# Patient Record
Sex: Female | Born: 1955
Health system: Southern US, Community
[De-identification: ages and names within clinical notes are randomized; demographics above are authoritative.]

## PROBLEM LIST (undated history)

## (undated) DIAGNOSIS — Z1211 Encounter for screening for malignant neoplasm of colon: Secondary | ICD-10-CM

## (undated) DIAGNOSIS — F32A Depression, unspecified: Secondary | ICD-10-CM

## (undated) DIAGNOSIS — K219 Gastro-esophageal reflux disease without esophagitis: Secondary | ICD-10-CM

## (undated) DIAGNOSIS — K5790 Diverticulosis of intestine, part unspecified, without perforation or abscess without bleeding: Secondary | ICD-10-CM

## (undated) DIAGNOSIS — F419 Anxiety disorder, unspecified: Secondary | ICD-10-CM

## (undated) DIAGNOSIS — M1990 Unspecified osteoarthritis, unspecified site: Secondary | ICD-10-CM

## (undated) DIAGNOSIS — F329 Major depressive disorder, single episode, unspecified: Secondary | ICD-10-CM

## (undated) HISTORY — DX: Encounter for screening for malignant neoplasm of colon: Z12.11

## (undated) HISTORY — PX: BREAST BIOPSY: SHX20

## (undated) HISTORY — PX: TONSILLECTOMY: SUR1361

## (undated) HISTORY — DX: Diverticulosis of intestine, part unspecified, without perforation or abscess without bleeding: K57.90

## (undated) HISTORY — PX: ABDOMINAL HYSTERECTOMY: SHX81

---
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
## 2004-09-28 ENCOUNTER — Ambulatory Visit: Payer: Self-pay | Admitting: Internal Medicine

## 2005-04-14 HISTORY — PX: BREAST LUMPECTOMY: SHX2

## 2005-09-25 ENCOUNTER — Encounter: Admission: RE | Admit: 2005-09-25 | Discharge: 2005-09-25 | Payer: Self-pay | Admitting: General Surgery

## 2005-10-05 ENCOUNTER — Encounter: Admission: RE | Admit: 2005-10-05 | Discharge: 2005-10-05 | Payer: Self-pay | Admitting: General Surgery

## 2005-10-27 ENCOUNTER — Ambulatory Visit (HOSPITAL_BASED_OUTPATIENT_CLINIC_OR_DEPARTMENT_OTHER): Admission: RE | Admit: 2005-10-27 | Discharge: 2005-10-27 | Payer: Self-pay | Admitting: General Surgery

## 2005-10-27 ENCOUNTER — Encounter: Admission: RE | Admit: 2005-10-27 | Discharge: 2005-10-27 | Payer: Self-pay | Admitting: General Surgery

## 2008-07-12 ENCOUNTER — Ambulatory Visit: Payer: Self-pay

## 2008-09-21 ENCOUNTER — Ambulatory Visit: Payer: Self-pay | Admitting: Obstetrics and Gynecology

## 2008-09-25 ENCOUNTER — Ambulatory Visit: Payer: Self-pay | Admitting: Obstetrics and Gynecology

## 2010-05-05 ENCOUNTER — Encounter: Payer: Self-pay | Admitting: General Surgery

## 2012-02-23 LAB — HM DEXA SCAN

## 2012-04-09 ENCOUNTER — Ambulatory Visit: Payer: Self-pay | Admitting: Gastroenterology

## 2012-07-10 ENCOUNTER — Emergency Department: Payer: Self-pay | Admitting: Internal Medicine

## 2012-07-10 LAB — CBC WITH DIFFERENTIAL/PLATELET
Basophil #: 0 10*3/uL (ref 0.0–0.1)
Eosinophil #: 0.2 10*3/uL (ref 0.0–0.7)
Lymphocyte %: 32.5 %
Neutrophil #: 2.7 10*3/uL (ref 1.4–6.5)
Platelet: 272 10*3/uL (ref 150–440)
RBC: 4.06 10*6/uL (ref 3.80–5.20)
WBC: 4.9 10*3/uL (ref 3.6–11.0)

## 2012-07-10 LAB — BASIC METABOLIC PANEL
Chloride: 111 mmol/L — ABNORMAL HIGH (ref 98–107)
Creatinine: 0.63 mg/dL (ref 0.60–1.30)
EGFR (African American): >60
Osmolality: 281 (ref 275–301)
Potassium: 3.6 mmol/L (ref 3.5–5.1)

## 2013-06-01 ENCOUNTER — Ambulatory Visit: Payer: Self-pay | Admitting: Obstetrics and Gynecology

## 2013-06-13 ENCOUNTER — Ambulatory Visit: Payer: Self-pay | Admitting: Obstetrics and Gynecology

## 2016-11-13 ENCOUNTER — Other Ambulatory Visit: Payer: Self-pay

## 2016-11-14 ENCOUNTER — Ambulatory Visit: Payer: BLUE CROSS/BLUE SHIELD

## 2016-11-14 ENCOUNTER — Encounter: Payer: Self-pay | Admitting: Podiatry

## 2016-11-14 ENCOUNTER — Ambulatory Visit (INDEPENDENT_AMBULATORY_CARE_PROVIDER_SITE_OTHER): Payer: BLUE CROSS/BLUE SHIELD

## 2016-11-14 ENCOUNTER — Ambulatory Visit (INDEPENDENT_AMBULATORY_CARE_PROVIDER_SITE_OTHER): Payer: BLUE CROSS/BLUE SHIELD | Admitting: Podiatry

## 2016-11-14 VITALS — BP 112/75 | HR 94 | Temp 99.6°F | Resp 16

## 2016-11-14 DIAGNOSIS — R52 Pain, unspecified: Secondary | ICD-10-CM

## 2016-11-14 DIAGNOSIS — L84 Corns and callosities: Secondary | ICD-10-CM | POA: Diagnosis not present

## 2016-11-14 DIAGNOSIS — M722 Plantar fascial fibromatosis: Secondary | ICD-10-CM | POA: Diagnosis not present

## 2016-11-14 MED ORDER — BETAMETHASONE SOD PHOS & ACET 6 (3-3) MG/ML IJ SUSP: 3.0000 mg | Freq: Once | INTRAMUSCULAR | Status: DC

## 2016-11-14 MED ORDER — TRAMADOL HCL 50 MG PO TABS: 50.0000 mg | ORAL_TABLET | Freq: Three times a day (TID) | ORAL | 0 refills | Status: DC | PRN

## 2016-11-14 NOTE — Progress Notes (Signed)
Patient ID: Shelley White, female   DOB: November 08, 1955, 61 y.o.   MRN: [id number]   HPI:  61 year old female very pleasant presents to the office today for evaluation of pain to the left arch of her foot. She states that there is a nodule on the bottom of her arch has been very symptomatic and painful. This pain has been going on for several months. She states that the nodule has increased in sizeover the past few months. Patient denies injury or trauma  patient also complans of painfue fourth digit of the right foot lateral aspect. Corns been there for several years. She experienced a sharp stabbing pains associated with the corn. Patient had a history of hammertoe correction to the fourth and fifth digits of the right foot. Patient states the corn pads help alleviate the symptoms.   Physical Exam: General: The patient is alert and oriented x3 in no acute distress.  Dermatology: Skin is warm, dry and supple bilateral lower extremities. Negative for open lesions or macerations. There is a hyperkeratotic callus lesion noted to the lateral aspect of the fourth digit at the distal end of the proximal phalanx. Very symptomatically painful with palpation  Vascular: Palpable pedal pulses bilaterally. No edema or erythema noted. Capillary refill within normal limits.  Neurological: Epicritic and protective threshold grossly intact bilaterally.   Musculoskeletal Exam: Range of motion within normal limits to all pedal and ankle joints bilateral. Muscle strength 5/5 in all groups bilateral.  There is a hard soft tissue palpable nodule noted along the medial band of the plantar fascia.  Likely consistent with a plantar fibroma.  Radiographic Exam:  Normal osseous mineralization. Joint spaces preserved. No fracture/dislocation/boney destruction.    Assessment: 1.  Plantar fibroma left foot  2. Symptomatically corn lesion fourth digit right foot   Plan of Care:  1. Patient was evaluated. X-rays reviewed  today 2.  Injection of 0.5 mL Celestone Soluspan injected into the symptomatic plantar fibroma left foot  3. Excisional debridement of the corn lesion was performed to the fourth toe right foot using an chisel blade without incident or bleeding  4. Today we did discuss surgical and conservative management of the plantar fibroma. At the moment are going to continue conservative management.  5. Return to clinic when necessary   Edrick Kins, DPM [facility]  Dr. Edrick Kins, DPM    [address], [place] [address]                Office [phone]  Fax [phone]

## 2016-11-14 NOTE — Progress Notes (Signed)
   Subjective:    Patient ID: Shelley White, female    DOB: Mar 26, 1956, 61 y.o.   MRN: [id number]  HPI    Review of Systems     Objective:   Physical Exam        Assessment & Plan:

## 2017-07-16 ENCOUNTER — Other Ambulatory Visit: Payer: Self-pay | Admitting: Family Medicine

## 2017-07-16 DIAGNOSIS — Z1231 Encounter for screening mammogram for malignant neoplasm of breast: Secondary | ICD-10-CM

## 2017-08-14 ENCOUNTER — Ambulatory Visit
Admission: RE | Admit: 2017-08-14 | Discharge: 2017-08-14 | Disposition: A | Discharge status: Home / Self Care | Payer: BLUE CROSS/BLUE SHIELD | Source: Ambulatory Visit | Attending: Family Medicine | Admitting: Family Medicine

## 2017-08-14 ENCOUNTER — Encounter: Payer: Self-pay | Admitting: Radiology

## 2017-08-14 DIAGNOSIS — Z1231 Encounter for screening mammogram for malignant neoplasm of breast: Secondary | ICD-10-CM | POA: Insufficient documentation

## 2017-09-25 ENCOUNTER — Ambulatory Visit: Payer: Self-pay | Admitting: Family Medicine

## 2017-12-08 ENCOUNTER — Observation Stay
Admission: EM | Admit: 2017-12-08 | Discharge: 2017-12-08 | Disposition: A | Discharge status: Home / Self Care | Payer: BLUE CROSS/BLUE SHIELD | Attending: Internal Medicine | Admitting: Internal Medicine

## 2017-12-08 ENCOUNTER — Emergency Department: Payer: BLUE CROSS/BLUE SHIELD

## 2017-12-08 ENCOUNTER — Other Ambulatory Visit: Payer: Self-pay

## 2017-12-08 DIAGNOSIS — M542 Cervicalgia: Secondary | ICD-10-CM | POA: Diagnosis not present

## 2017-12-08 DIAGNOSIS — R0602 Shortness of breath: Secondary | ICD-10-CM | POA: Diagnosis not present

## 2017-12-08 DIAGNOSIS — M722 Plantar fascial fibromatosis: Secondary | ICD-10-CM

## 2017-12-08 DIAGNOSIS — Z885 Allergy status to narcotic agent status: Secondary | ICD-10-CM | POA: Insufficient documentation

## 2017-12-08 DIAGNOSIS — Z7982 Long term (current) use of aspirin: Secondary | ICD-10-CM | POA: Diagnosis not present

## 2017-12-08 DIAGNOSIS — R0789 Other chest pain: Principal | ICD-10-CM | POA: Insufficient documentation

## 2017-12-08 DIAGNOSIS — R079 Chest pain, unspecified: Secondary | ICD-10-CM | POA: Diagnosis present

## 2017-12-08 DIAGNOSIS — I700 Atherosclerosis of aorta: Secondary | ICD-10-CM | POA: Insufficient documentation

## 2017-12-08 DIAGNOSIS — J439 Emphysema, unspecified: Secondary | ICD-10-CM | POA: Diagnosis not present

## 2017-12-08 DIAGNOSIS — Z79899 Other long term (current) drug therapy: Secondary | ICD-10-CM | POA: Insufficient documentation

## 2017-12-08 DIAGNOSIS — F17210 Nicotine dependence, cigarettes, uncomplicated: Secondary | ICD-10-CM | POA: Insufficient documentation

## 2017-12-08 DIAGNOSIS — I200 Unstable angina: Secondary | ICD-10-CM

## 2017-12-08 LAB — BASIC METABOLIC PANEL
ANION GAP: 8 (ref 5–15)
BUN: 12 mg/dL (ref 8–23)
CHLORIDE: 106 mmol/L (ref 98–111)
CO2: 27 mmol/L (ref 22–32)
Calcium: 9.2 mg/dL (ref 8.9–10.3)
Creatinine, Ser: 0.76 mg/dL (ref 0.44–1.00)
GFR calc Af Amer: >60 mL/min (ref >60)
Glucose, Bld: 82 mg/dL (ref 70–99)
POTASSIUM: 3.7 mmol/L (ref 3.5–5.1)
SODIUM: 141 mmol/L (ref 135–145)

## 2017-12-08 LAB — CBC
HEMATOCRIT: 38.9 % (ref 35.0–47.0)
HEMOGLOBIN: 13.2 g/dL (ref 12.0–16.0)
MCH: 29.4 pg (ref 26.0–34.0)
MCHC: 33.9 g/dL (ref 32.0–36.0)
MCV: 86.7 fL (ref 80.0–100.0)
Platelets: 250 10*3/uL (ref 150–440)
RBC: 4.49 MIL/uL (ref 3.80–5.20)
RDW: 14.2 % (ref 11.5–14.5)
WBC: 5.5 10*3/uL (ref 3.6–11.0)

## 2017-12-08 LAB — TROPONIN I: Troponin I: <0.03 ng/mL (ref <0.03)

## 2017-12-08 NOTE — ED Notes (Signed)
Pt resting. Respirations even and unlabored. NAD. Stretcher in low and locked position. Call bell in reach. Denies needs at this time. Will continue to monitor. 

## 2017-12-08 NOTE — ED Provider Notes (Addendum)
[facility] Emergency Department Provider Note   ____________________________________________   First MD Initiated Contact with Patient 12/08/17 1249     (approximate)  I have reviewed the triage vital signs and the nursing notes.   HISTORY  Chief Complaint Chest Pain   HPI Shelley White is a 62 y.o. female who reports she has been having some intermittent pain in the left side of the neck and left shoulder for the last several days.  It was associated with left arm heaviness.  Today she was going to [facility] to get it checked out and she became short of breath with some chest heaviness that felt like indigestion and a little bit of dizziness.  So she stopped and got driven here.  All of her symptoms went away with rest.  She has not had any of this previously.  She took a 325 mg aspirin today.  Pain lasted perhaps a half an hour.   History reviewed. No pertinent past medical history.  There are no active problems to display for this patient.   Past Surgical History:  Procedure Laterality Date  . ABDOMINAL HYSTERECTOMY    . BREAST BIOPSY Right     Prior to Admission medications   Medication Sig Start Date End Date Taking? Authorizing Provider  aspirin EC 81 MG tablet Take 81 mg by mouth daily.    Yes [provider]  buPROPion (WELLBUTRIN XL) 150 MG 24 hr tablet Take 150 mg by mouth daily. 11/18/17  Yes [provider]  Cholecalciferol (VITAMIN D-1000 MAX ST) 1000 units tablet Take 1,000 Units by mouth daily.    Yes [provider]  Multiple Vitamin (MULTI-VITAMINS) TABS Take 1 tablet by mouth daily.    Yes [provider]  traMADol (ULTRAM) 50 MG tablet Take 1 tablet (50 mg total) by mouth every 8 (eight) hours as needed. Patient not taking: Reported on 12/08/2017 11/14/16   Edrick Kins, DPM    Allergies Codeine  Family History  Problem Relation Age of Onset  . Breast cancer Neg Hx     Social  History Social History   Tobacco Use  . Smoking status: Current Every Day Smoker    Packs/day: 1.00  . Smokeless tobacco: Never Used  Substance Use Topics  . Alcohol use: No  . Drug use: No    Review of Systems  Constitutional: No fever/chills Eyes: No visual changes. ENT: No sore throat. Cardiovascular: chest pain. Respiratory:  shortness of breath. Gastrointestinal: No abdominal pain.  No nausea, no vomiting.  No diarrhea.  No constipation. Genitourinary: Negative for dysuria. Musculoskeletal: Negative for back pain. Skin: Negative for rash. Neurological: Negative for headaches, focal weakness  ____________________________________________   PHYSICAL EXAM:  VITAL SIGNS: ED Triage Vitals  Enc Vitals Group     BP 12/08/17 1131 (!) 144/67     Pulse Rate 12/08/17 1131 78     Resp 12/08/17 1131 18     Temp 12/08/17 1131 98.2 F (36.8 C)     Temp Source 12/08/17 1131 Oral     SpO2 12/08/17 1131 100 %     Weight 12/08/17 1130 155 lb (70.3 kg)     Height 12/08/17 1130 5\' 6"  (1.676 m)     Head Circumference --      Peak Flow --      Pain Score 12/08/17 1129 5     Pain Loc --      Pain Edu? --  Excl. in [place]? --    Constitutional: Alert and oriented. Well appearing and in no acute distress. Eyes: Conjunctivae are normal. PERRL. EOMI. Head: Atraumatic. Nose: No congestion/rhinnorhea. Mouth/Throat: Mucous membranes are moist.  Oropharynx non-erythematous. Neck: No stridor.   Cardiovascular: Normal rate, regular rhythm. Grossly normal heart sounds.  Good peripheral circulation. Respiratory: Normal respiratory effort.  No retractions. Lungs CTAB. Gastrointestinal: Soft and nontender. No distention. No abdominal bruits. No CVA tenderness. Musculoskeletal: No lower extremity tenderness nor edema. Neurologic:  Normal speech and language. No gross focal neurologic deficits are appreciated. No gait instability. Skin:  Skin is warm, dry and intact. No rash  noted. Psychiatric: Mood and affect are normal. Speech and behavior are normal.  ____________________________________________   LABS (all labs ordered are listed, but only abnormal results are displayed)  Labs Reviewed  BASIC METABOLIC PANEL  CBC  TROPONIN I  TROPONIN I   ____________________________________________  EKG  EKG read and interpreted by me shows normal sinus rhythm rate of 78 normal axis no acute ST-T wave changes ____________________________________________  RADIOLOGY  ED MD interpretation: Chest x-ray read by radiology reviewed by me shows a possible nodule.  Official radiology report(s): Dg Chest 2 View  Result Date: 12/08/2017 CLINICAL DATA:  Chest pain EXAM: CHEST - 2 VIEW COMPARISON:  None. FINDINGS: Normal heart size and mediastinal contours. No acute infiltrate or edema. Rounded 16 mm density projecting inferior to the hila, favor vessel although larger than adjacent vessels. No effusion or pneumothorax. No acute osseous findings. IMPRESSION: 1. 16 mm nodular density in the lateral view. This may reflect a vessel but recommend noncontrast chest CT in this smoker. 2. No acute finding. Electronically Signed   By: Monte Fantasia M.D.   On: 12/08/2017 11:58   Ct Chest Wo Contrast  Result Date: 12/08/2017 CLINICAL DATA:  Possible nodule on chest x-ray. EXAM: CT CHEST WITHOUT CONTRAST TECHNIQUE: Multidetector CT imaging of the chest was performed following the standard protocol without IV contrast. COMPARISON:  Chest x-ray from same day. FINDINGS: Cardiovascular: Normal heart size. No pericardial effusion. Mild atherosclerosis of the left anterior descending coronary artery and descending thoracic aorta. Normal caliber thoracic aorta. Mediastinum/Nodes: No enlarged mediastinal or axillary lymph nodes. Thyroid gland, trachea, and esophagus demonstrate no significant findings. Lungs/Pleura: Mild centrilobular and paraseptal emphysema. No focal consolidation, pleural  effusion, or pneumothorax. No suspicious pulmonary nodule. Upper Abdomen: No acute abnormality. Musculoskeletal: No chest wall mass or suspicious bone lesions identified. IMPRESSION: 1. No pulmonary nodule. The nodular density seen on x-ray likely corresponds to a pulmonary vessel. 2.  No acute intrathoracic process. 3.  Emphysema (ICD10-J43.9). 4.  Aortic atherosclerosis (ICD10-I70.0). Electronically Signed   By: Titus Dubin M.D.   On: 12/08/2017 13:39    ____________________________________________   PROCEDURES  Procedure(s) performed:   Procedures  Critical Care performed:   ____________________________________________   INITIAL IMPRESSION / ASSESSMENT AND PLAN / ED COURSE       Clinical Course as of Dec 09 1502  Tue Dec 08, 2017  1249 MCV: 86.7 [PM]    Clinical Course User Index [PM] Nena Polio, MD     ____________________________________________   FINAL CLINICAL IMPRESSION(S) / ED DIAGNOSES  Patient's new onset of neck and arm discomfort worse with exertion followed by chest pain worse with exertion better with rest sounds like it could be unstable angina I will discuss with hospitalist about admitting this patient  Final diagnoses:  Unstable angina [facility])     ED Discharge Orders  None    Patient declines admission.  I am very worried about the chest pain shortness of breath that developed while she was walking and resolve with rest.  Not sure if the neck and shoulder pain and arm heaviness are involved with this or are actually from the painting.  Hence patient will not stay I will send her to cardiology as soon as possible.  I called Dr. Yancey Flemings and made her the appointment myself.   Note:  This document was prepared using Dragon voice recognition software and may include unintentional dictation errors.    Nena Polio, MD 12/08/17 1505    Nena Polio, MD 12/08/17 [phone]

## 2017-12-08 NOTE — ED Notes (Signed)
Inpatient MD at bedside and pt states that she does not want to be admitted. ED MD aware and speaking with inpatient MD. Will wait to call report.

## 2017-12-08 NOTE — Discharge Instructions (Addendum)
I spoke with Dr. Humphrey Rolls the cardiologist.  He said he will see you tomorrow in the office at 10:00.  He said when you go there tell t his office staff that Dr. Humphrey Rolls himself made the appointment.  Please return at once if you have further chest tightness or pain or if your arm gets worse.  In the meantime you can take Motrin 2 of the over-the-counter pills 4 times a day for the pain.  Take one regular aspirin every day until you see Dr. Humphrey Rolls as well.  Use ice or heat 20 minutes every 2 hours as well.  Be careful not to fall asleep with the ice on it with a heating pad on as they can burn you.  Be careful to wrap the ice in a towel.

## 2017-12-08 NOTE — ED Triage Notes (Signed)
Pt states she was on her way to the [facility] walk in clinic today for left sided neck pain that radiates into the arm and on the way she became SOB with chest pain that lasted for a few minutes, denies any pain at present in the chest .

## 2017-12-08 NOTE — Consult Note (Addendum)
Medical Consultation  Shelley White IYM:[id number] DOB: 11-18-1955 DOA: 12/08/2017 PCP: Lorelee Market, MD   Requesting physician: dr Cinda Quest Date of consultation: 12/08/2017 Reason for consultation:  Neck pain  Impression/Recommendations  62 year old female with no past medical history who presents to the ER with initial complaint of neck pain after painting x1 week.  1.  Shortness of breath: Patient has ruled out for ACS.  I do not suspect shortness of breath is related to cardiac issue.  EKG is normal.  Telemetry shows no acute changes.  She can have outpatient follow-up with cardiology.  2.  Neck pain: This sounds musculoskeletal in nature She would benefit from Flexeril at discharge.  3. Tobacco dependence: Patient is encouraged to quit smoking. Counseling was provided for 4 minutes.   Ok to discharge home with outpatient follow up with cardiology  Chief Complaint:  NECK Pain  HPI:   62 year old female with no past medical history presents to the ER due to neck pain.  Patient reports that she was painting about a week ago and since that time has had severe neck pain radiating to her left arm.  She also stated that she has some shortness of breath and felt like she was having a anxiety attack this afternoon.  She denies any chest pain or chest pressure.  She denies jaw pain, nausea or vomiting.  Patient would like to go home today from the ER.  She is a 36 year old mother that she needs to take care of.   Review of Systems  Constitutional: Negative for fever, chills weight loss HENT: Negative for ear pain, nosebleeds, congestion, facial swelling, rhinorrhea, neck pain, neck stiffness and ear discharge.   Respiratory: Negative for cough, shortness of breath, wheezing  Cardiovascular: Negative for chest pain, palpitations and leg swelling.  Gastrointestinal: Negative for heartburn, abdominal pain, vomiting, diarrhea or consitpation Genitourinary: Negative for dysuria,  urgency, frequency, hematuria Musculoskeletal: Negative for back pain or joint pain ++ neck pain Neurological: Negative for dizziness, seizures, syncope, focal weakness,  numbness and headaches.  Hematological: Does not bruise/bleed easily.  Psychiatric/Behavioral: Negative for hallucinations, confusion, dysphoric mood   History reviewed. No pertinent past medical history. Past Surgical History:  Procedure Laterality Date  . ABDOMINAL HYSTERECTOMY    . BREAST BIOPSY Right    Social History:  reports that she has been smoking. She has been smoking about 1.00 pack per day. She has never used smokeless tobacco. She reports that she does not drink alcohol or use drugs.  Allergies  Allergen Reactions  . Codeine Itching and Swelling   Family History  Problem Relation Age of Onset  . Breast cancer Neg Hx     Prior to Admission medications   Medication Sig Start Date End Date Taking? Authorizing Provider  aspirin EC 81 MG tablet Take 81 mg by mouth daily.    Yes [provider]  buPROPion (WELLBUTRIN XL) 150 MG 24 hr tablet Take 150 mg by mouth daily. 11/18/17  Yes [provider]  Cholecalciferol (VITAMIN D-1000 MAX ST) 1000 units tablet Take 1,000 Units by mouth daily.    Yes [provider]  Multiple Vitamin (MULTI-VITAMINS) TABS Take 1 tablet by mouth daily.    Yes [provider]  traMADol (ULTRAM) 50 MG tablet Take 1 tablet (50 mg total) by mouth every 8 (eight) hours as needed. Patient not taking: Reported on 12/08/2017 11/14/16   Edrick Kins, DPM    Physical Exam: Blood pressure 129/80, pulse 81, temperature 98.5 F (  36.9 C), temperature source Oral, resp. rate 18, height 5\' 6"  (1.676 m), weight 70.3 kg, SpO2 99 %. @VITALS2 @ [employer]   12/08/17 1130  Weight: 70.3 kg   No intake or output data in the 24 hours ending 12/08/17 1614   Constitutional: Appears well-developed and well-nourished. No distress. HENT: Normocephalic. Marland Kitchen  Oropharynx is clear and moist.  Eyes: Conjunctivae and EOM are normal. PERRLA, no scleral icterus.  Neck: She has a crick/muscle spasm. Neck supple. No JVD. No tracheal deviation. CVS: RRR, S1/S2 +, no murmurs, no gallops, no carotid bruit.  Pulmonary: Effort and breath sounds normal, no stridor, rhonchi, wheezes, rales.  Abdominal: Soft. BS +,  no distension, tenderness, rebound or guarding.  Musculoskeletal: Normal range of motion. No edema and no tenderness.  Neuro: Alert. CN 2-12 grossly intact. No focal deficits. Skin: Skin is warm and dry. No rash noted. Psychiatric: Normal mood and affect.    Labs  Basic Metabolic Panel: Recent Labs  Lab 12/08/17 1133  NA 141  K 3.7  CL 106  CO2 27  GLUCOSE 82  BUN 12  CREATININE 0.76  CALCIUM 9.2   Liver Function Tests: No results for input(s): AST, ALT, ALKPHOS, BILITOT, PROT, ALBUMIN in the last 168 hours. No results for input(s): LIPASE, AMYLASE in the last 168 hours.  CBC: Recent Labs  Lab 12/08/17 1133  WBC 5.5  HGB 13.2  HCT 38.9  MCV 86.7  PLT 250   Cardiac Enzymes: Recent Labs  Lab 12/08/17 1350  TROPONINI <0.03   BNP: Invalid input(s): POCBNP CBG: No results for input(s): GLUCAP in the last 168 hours.  Radiological Exams: Dg Chest 2 View  Result Date: 12/08/2017 CLINICAL DATA:  Chest pain EXAM: CHEST - 2 VIEW COMPARISON:  None. FINDINGS: Normal heart size and mediastinal contours. No acute infiltrate or edema. Rounded 16 mm density projecting inferior to the hila, favor vessel although larger than adjacent vessels. No effusion or pneumothorax. No acute osseous findings. IMPRESSION: 1. 16 mm nodular density in the lateral view. This may reflect a vessel but recommend noncontrast chest CT in this smoker. 2. No acute finding. Electronically Signed   By: Monte Fantasia M.D.   On: 12/08/2017 11:58   Ct Chest Wo Contrast  Result Date: 12/08/2017 CLINICAL DATA:  Possible nodule on chest x-ray. EXAM: CT CHEST  WITHOUT CONTRAST TECHNIQUE: Multidetector CT imaging of the chest was performed following the standard protocol without IV contrast. COMPARISON:  Chest x-ray from same day. FINDINGS: Cardiovascular: Normal heart size. No pericardial effusion. Mild atherosclerosis of the left anterior descending coronary artery and descending thoracic aorta. Normal caliber thoracic aorta. Mediastinum/Nodes: No enlarged mediastinal or axillary lymph nodes. Thyroid gland, trachea, and esophagus demonstrate no significant findings. Lungs/Pleura: Mild centrilobular and paraseptal emphysema. No focal consolidation, pleural effusion, or pneumothorax. No suspicious pulmonary nodule. Upper Abdomen: No acute abnormality. Musculoskeletal: No chest wall mass or suspicious bone lesions identified. IMPRESSION: 1. No pulmonary nodule. The nodular density seen on x-ray likely corresponds to a pulmonary vessel. 2.  No acute intrathoracic process. 3.  Emphysema (ICD10-J43.9). 4.  Aortic atherosclerosis (ICD10-I70.0). Electronically Signed   By: Titus Dubin M.D.   On: 12/08/2017 13:39    EKG: NSR no ST elevation  Case d/w Dr Cinda Quest   Thank you for allowing me to participate in the care of your patient. We will continue to follow.   Note: This dictation was prepared with Dragon dictation along with smaller phrase technology. Any transcriptional errors that result  from this process are unintentional.  Time spent:  45 minutes  Ichelle Harral, MD

## 2017-12-08 NOTE — ED Notes (Signed)
Pt ambulatory to restroom. Respirations even and unlabored. NAD. Stretcher in low and locked position. Call bell in reach. Denies needs at this time. Will continue to monitor.

## 2017-12-08 NOTE — ED Notes (Signed)
Updated pt that MDs are talking about plan for her at this time

## 2017-12-08 NOTE — ED Notes (Signed)
Patient transported to CT 

## 2017-12-08 NOTE — ED Notes (Signed)
Floor unable to take report at this time. Will call back in 5 mins.

## 2018-01-06 ENCOUNTER — Other Ambulatory Visit: Payer: Self-pay | Admitting: Nephrology

## 2018-01-06 DIAGNOSIS — R319 Hematuria, unspecified: Secondary | ICD-10-CM

## 2018-01-13 ENCOUNTER — Ambulatory Visit
Admission: RE | Admit: 2018-01-13 | Discharge: 2018-01-13 | Disposition: A | Discharge status: Home / Self Care | Payer: BLUE CROSS/BLUE SHIELD | Source: Ambulatory Visit | Attending: Nephrology | Admitting: Nephrology

## 2018-01-13 DIAGNOSIS — R319 Hematuria, unspecified: Secondary | ICD-10-CM | POA: Insufficient documentation

## 2018-10-13 DIAGNOSIS — U071 COVID-19: Secondary | ICD-10-CM

## 2018-10-13 HISTORY — DX: COVID-19: U07.1

## 2018-10-29 ENCOUNTER — Other Ambulatory Visit: Payer: BLUE CROSS/BLUE SHIELD

## 2018-10-29 ENCOUNTER — Other Ambulatory Visit: Payer: Self-pay

## 2018-10-29 DIAGNOSIS — Z20822 Contact with and (suspected) exposure to covid-19: Secondary | ICD-10-CM

## 2018-11-02 LAB — NOVEL CORONAVIRUS, NAA: SARS-CoV-2, NAA: NOT DETECTED

## 2018-11-03 ENCOUNTER — Other Ambulatory Visit: Payer: Self-pay | Admitting: Family Medicine

## 2018-11-03 DIAGNOSIS — Z1231 Encounter for screening mammogram for malignant neoplasm of breast: Secondary | ICD-10-CM

## 2018-11-05 ENCOUNTER — Other Ambulatory Visit: Payer: Self-pay

## 2018-11-05 DIAGNOSIS — Z20822 Contact with and (suspected) exposure to covid-19: Secondary | ICD-10-CM

## 2018-11-08 LAB — NOVEL CORONAVIRUS, NAA: SARS-CoV-2, NAA: DETECTED — AB

## 2018-11-13 ENCOUNTER — Emergency Department: Payer: BLUE CROSS/BLUE SHIELD

## 2018-11-13 ENCOUNTER — Other Ambulatory Visit: Payer: Self-pay

## 2018-11-13 ENCOUNTER — Emergency Department
Admission: EM | Admit: 2018-11-13 | Discharge: 2018-11-13 | Disposition: A | Discharge status: Home / Self Care | Payer: BLUE CROSS/BLUE SHIELD | Attending: Student in an Organized Health Care Education/Training Program | Admitting: Student in an Organized Health Care Education/Training Program

## 2018-11-13 DIAGNOSIS — Z7982 Long term (current) use of aspirin: Secondary | ICD-10-CM | POA: Insufficient documentation

## 2018-11-13 DIAGNOSIS — R059 Cough, unspecified: Secondary | ICD-10-CM

## 2018-11-13 DIAGNOSIS — F17210 Nicotine dependence, cigarettes, uncomplicated: Secondary | ICD-10-CM | POA: Insufficient documentation

## 2018-11-13 DIAGNOSIS — Z79899 Other long term (current) drug therapy: Secondary | ICD-10-CM | POA: Diagnosis not present

## 2018-11-13 DIAGNOSIS — R05 Cough: Secondary | ICD-10-CM | POA: Insufficient documentation

## 2018-11-13 DIAGNOSIS — U071 COVID-19: Secondary | ICD-10-CM | POA: Diagnosis not present

## 2018-11-13 DIAGNOSIS — R509 Fever, unspecified: Secondary | ICD-10-CM | POA: Diagnosis present

## 2018-11-13 LAB — CBC WITH DIFFERENTIAL/PLATELET
Abs Immature Granulocytes: 0.02 10*3/uL (ref 0.00–0.07)
Basophils Absolute: 0 10*3/uL (ref 0.0–0.1)
Basophils Relative: 0 %
Eosinophils Absolute: 0 10*3/uL (ref 0.0–0.5)
Eosinophils Relative: 1 %
HCT: 36.1 % (ref 36.0–46.0)
Hemoglobin: 12 g/dL (ref 12.0–15.0)
Immature Granulocytes: 0 %
Lymphocytes Relative: 28 %
Lymphs Abs: 1.6 10*3/uL (ref 0.7–4.0)
MCH: 27.9 pg (ref 26.0–34.0)
MCHC: 33.2 g/dL (ref 30.0–36.0)
MCV: 84 fL (ref 80.0–100.0)
Monocytes Absolute: 0.3 10*3/uL (ref 0.1–1.0)
Monocytes Relative: 5 %
Neutro Abs: 3.7 10*3/uL (ref 1.7–7.7)
Neutrophils Relative %: 66 %
Platelets: 267 10*3/uL (ref 150–400)
RBC: 4.3 MIL/uL (ref 3.87–5.11)
RDW: 13.6 % (ref 11.5–15.5)
WBC: 5.7 10*3/uL (ref 4.0–10.5)
nRBC: 0 % (ref 0.0–0.2)

## 2018-11-13 LAB — COMPREHENSIVE METABOLIC PANEL
ALT: 29 U/L (ref 0–44)
AST: 33 U/L (ref 15–41)
Albumin: 4.3 g/dL (ref 3.5–5.0)
Alkaline Phosphatase: 89 U/L (ref 38–126)
Anion gap: 8 (ref 5–15)
BUN: 10 mg/dL (ref 8–23)
CO2: 23 mmol/L (ref 22–32)
Calcium: 8.8 mg/dL — ABNORMAL LOW (ref 8.9–10.3)
Chloride: 109 mmol/L (ref 98–111)
Creatinine, Ser: 0.65 mg/dL (ref 0.44–1.00)
GFR calc Af Amer: >60 mL/min (ref >60)
GFR calc non Af Amer: >60 mL/min (ref >60)
Glucose, Bld: 115 mg/dL — ABNORMAL HIGH (ref 70–99)
Potassium: 3.3 mmol/L — ABNORMAL LOW (ref 3.5–5.1)
Sodium: 140 mmol/L (ref 135–145)
Total Bilirubin: 0.4 mg/dL (ref 0.3–1.2)
Total Protein: 7.7 g/dL (ref 6.5–8.1)

## 2018-11-13 LAB — URINALYSIS, COMPLETE (UACMP) WITH MICROSCOPIC
Bacteria, UA: NONE SEEN
Bilirubin Urine: NEGATIVE
Glucose, UA: NEGATIVE mg/dL
Hgb urine dipstick: NEGATIVE
Ketones, ur: NEGATIVE mg/dL
Leukocytes,Ua: NEGATIVE
Nitrite: NEGATIVE
Protein, ur: NEGATIVE mg/dL
Specific Gravity, Urine: 1.014 (ref 1.005–1.030)
pH: 6 (ref 5.0–8.0)

## 2018-11-13 MED ORDER — ALBUTEROL SULFATE HFA 108 (90 BASE) MCG/ACT IN AERS: 2.0000 | INHALATION_SPRAY | Freq: Four times a day (QID) | RESPIRATORY_TRACT | 0 refills | Status: DC | PRN

## 2018-11-13 NOTE — ED Notes (Signed)
EDP Robinson at bedside.  

## 2018-11-13 NOTE — ED Provider Notes (Signed)
[facility] Emergency Department Provider Note    First MD Initiated Contact with Patient 11/13/18 1524     (approximate)  I have reviewed the triage vital signs and the nursing notes.   HISTORY  Chief Complaint Fatigue and Fever    HPI Shelley White is a 63 y.o. female previously healthy with no history of asthma or COPD presents with roughly 2 weeks of progressively worsening flulike illness.  She did recently test positive for coronavirus on the 24th of last month.  States that she does not feel like she is getting any better.  States that she is having exertional dyspnea and nonproductive cough.  Has not been on any steroids, hydroxychloroquine or antibiotic.  States that she is having some flank discomfort and dysuria.  Has had urinary tract infections in the past.  Is having fevers and chills.  Denies any nausea or vomiting.    History reviewed. No pertinent past medical history. Family History  Problem Relation Age of Onset  . Breast cancer Neg Hx    Past Surgical History:  Procedure Laterality Date  . ABDOMINAL HYSTERECTOMY    . BREAST BIOPSY Right    Patient Active Problem List   Diagnosis Date Noted  . Chest pain 12/08/2017      Prior to Admission medications   Medication Sig Start Date End Date Taking? Authorizing Provider  albuterol (VENTOLIN HFA) 108 (90 Base) MCG/ACT inhaler Inhale 2 puffs into the lungs every 6 (six) hours as needed for wheezing or shortness of breath. 11/13/18   Merlyn Lot, MD  aspirin EC 81 MG tablet Take 81 mg by mouth daily.     [provider]  buPROPion (WELLBUTRIN XL) 150 MG 24 hr tablet Take 150 mg by mouth daily. 11/18/17   [provider]  Cholecalciferol (VITAMIN D-1000 MAX ST) 1000 units tablet Take 1,000 Units by mouth daily.     [provider]  Multiple Vitamin (MULTI-VITAMINS) TABS Take 1 tablet by mouth daily.     [provider]  traMADol (ULTRAM) 50 MG  tablet Take 1 tablet (50 mg total) by mouth every 8 (eight) hours as needed. Patient not taking: Reported on 12/08/2017 11/14/16   Edrick Kins, DPM    Allergies Codeine    Social History Social History   Tobacco Use  . Smoking status: Current Every Day Smoker    Packs/day: 1.00  . Smokeless tobacco: Never Used  Substance Use Topics  . Alcohol use: No  . Drug use: No    Review of Systems Patient denies headaches, rhinorrhea, blurry vision, numbness, shortness of breath, chest pain, edema, cough, abdominal pain, nausea, vomiting, diarrhea, dysuria, fevers, rashes or hallucinations unless otherwise stated above in HPI. ____________________________________________   PHYSICAL EXAM:  VITAL SIGNS: Vitals:   11/13/18 1533 11/13/18 1619  BP: (!) 142/75   Pulse: 91   Resp: 18   Temp: 98.6 F (37 C)   SpO2:  98%    Constitutional: Alert and oriented.  Eyes: Conjunctivae are normal.  Head: Atraumatic. Nose: No congestion/rhinnorhea. Mouth/Throat: Mucous membranes are moist.   Neck: No stridor. Painless ROM.  Cardiovascular: Normal rate, regular rhythm. Grossly normal heart sounds.  Good peripheral circulation. Respiratory: Normal respiratory effort.  No retractions. Lungs CTAB. Gastrointestinal: Soft and nontender. No distention. No abdominal bruits. No CVA tenderness. Genitourinary:  Musculoskeletal: No lower extremity tenderness nor edema.  No joint effusions. Neurologic:  Normal speech and language. No gross focal neurologic deficits are  appreciated. No facial droop Skin:  Skin is warm, dry and intact. No rash noted. Psychiatric: Mood and affect are normal. Speech and behavior are normal.  ____________________________________________   LABS (all labs ordered are listed, but only abnormal results are displayed)  Results for orders placed or performed during the hospital encounter of 11/13/18 (from the past 24 hour(s))  CBC with Differential/Platelet     Status: None    Collection Time: 11/13/18  3:48 PM  Result Value Ref Range   WBC 5.7 4.0 - 10.5 K/uL   RBC 4.30 3.87 - 5.11 MIL/uL   Hemoglobin 12.0 12.0 - 15.0 g/dL   HCT 36.1 36.0 - 46.0 %   MCV 84.0 80.0 - 100.0 fL   MCH 27.9 26.0 - 34.0 pg   MCHC 33.2 30.0 - 36.0 g/dL   RDW 13.6 11.5 - 15.5 %   Platelets 267 150 - 400 K/uL   nRBC 0.0 0.0 - 0.2 %   Neutrophils Relative % 66 %   Neutro Abs 3.7 1.7 - 7.7 K/uL   Lymphocytes Relative 28 %   Lymphs Abs 1.6 0.7 - 4.0 K/uL   Monocytes Relative 5 %   Monocytes Absolute 0.3 0.1 - 1.0 K/uL   Eosinophils Relative 1 %   Eosinophils Absolute 0.0 0.0 - 0.5 K/uL   Basophils Relative 0 %   Basophils Absolute 0.0 0.0 - 0.1 K/uL   Immature Granulocytes 0 %   Abs Immature Granulocytes 0.02 0.00 - 0.07 K/uL  Comprehensive metabolic panel     Status: Abnormal   Collection Time: 11/13/18  3:48 PM  Result Value Ref Range   Sodium 140 135 - 145 mmol/L   Potassium 3.3 (L) 3.5 - 5.1 mmol/L   Chloride 109 98 - 111 mmol/L   CO2 23 22 - 32 mmol/L   Glucose, Bld 115 (H) 70 - 99 mg/dL   BUN 10 8 - 23 mg/dL   Creatinine, Ser 0.65 0.44 - 1.00 mg/dL   Calcium 8.8 (L) 8.9 - 10.3 mg/dL   Total Protein 7.7 6.5 - 8.1 g/dL   Albumin 4.3 3.5 - 5.0 g/dL   AST 33 15 - 41 U/L   ALT 29 0 - 44 U/L   Alkaline Phosphatase 89 38 - 126 U/L   Total Bilirubin 0.4 0.3 - 1.2 mg/dL   GFR calc non Af Amer >60 >60 mL/min   GFR calc Af Amer >60 >60 mL/min   Anion gap 8 5 - 15  Urinalysis, Complete w Microscopic     Status: Abnormal   Collection Time: 11/13/18  3:48 PM  Result Value Ref Range   Color, Urine YELLOW (A) YELLOW   APPearance CLEAR (A) CLEAR   Specific Gravity, Urine 1.014 1.005 - 1.030   pH 6.0 5.0 - 8.0   Glucose, UA NEGATIVE NEGATIVE mg/dL   Hgb urine dipstick NEGATIVE NEGATIVE   Bilirubin Urine NEGATIVE NEGATIVE   Ketones, ur NEGATIVE NEGATIVE mg/dL   Protein, ur NEGATIVE NEGATIVE mg/dL   Nitrite NEGATIVE NEGATIVE   Leukocytes,Ua NEGATIVE NEGATIVE   RBC / HPF  0-5 0 - 5 RBC/hpf   WBC, UA 0-5 0 - 5 WBC/hpf   Bacteria, UA NONE SEEN NONE SEEN   Squamous Epithelial / LPF 0-5 0 - 5   Mucus PRESENT    ____________________________________________ ____________________________________________  RADIOLOGY  I personally reviewed all radiographic images ordered to evaluate for the above acute complaints and reviewed radiology reports and findings.  These findings were personally discussed with  the patient.  Please see medical record for radiology report.  ____________________________________________   PROCEDURES  Procedure(s) performed:  Procedures    Critical Care performed: no ____________________________________________   INITIAL IMPRESSION / ASSESSMENT AND PLAN / ED COURSE  Pertinent labs & imaging results that were available during my care of the patient were reviewed by me and considered in my medical decision making (see chart for details).   DDX: covid 19, pna, bronhcitis, uti, pyelo  Shelley White is a 63 y.o. who presents to the ED with symptoms as described above.  Patient hemodynamically stable without any evidence of hypoxia.  Abdominal exam soft and benign.  Clinically she appears well in no acute distress.  Will check blood work.  Expect that a lot of her symptoms are secondary to covid 19.  The patient will be placed on continuous pulse oximetry and telemetry for monitoring.  Laboratory evaluation will be sent to evaluate for the above complaints.     Clinical Course as of Nov 12 1821  Sat Nov 13, 2018  1820 Patient able to ambulate with steady gait with no hypoxia no shortness of breath.  Do believe she stable and appropriate for continued outpatient management.  We discussed expected course of Covid and that she is likely going to have some fatigue for the next several days.  Encourage increased fluid intake.  Will rx albuterol inhaler. Discussed signs and symptoms for which she should seek medical attention.   [PR]     Clinical Course User Index [PR] Merlyn Lot, MD    The patient was evaluated in Emergency Department today for the symptoms described in the history of present illness. He/she was evaluated in the context of the global COVID-19 pandemic, which necessitated consideration that the patient might be at risk for infection with the SARS-CoV-2 virus that causes COVID-19. Institutional protocols and algorithms that pertain to the evaluation of patients at risk for COVID-19 are in a state of rapid change based on information released by regulatory bodies including the CDC and federal and state organizations. These policies and algorithms were followed during the patient's care in the ED.  As part of my medical decision making, I reviewed the following data within the [place] notes reviewed and incorporated, Labs reviewed, notes from prior ED visits and [place] Controlled Substance Database   ____________________________________________   FINAL CLINICAL IMPRESSION(S) / ED DIAGNOSES  Final diagnoses:  COVID-19 virus infection  Cough      NEW MEDICATIONS STARTED DURING THIS VISIT:  New Prescriptions   ALBUTEROL (VENTOLIN HFA) 108 (90 BASE) MCG/ACT INHALER    Inhale 2 puffs into the lungs every 6 (six) hours as needed for wheezing or shortness of breath.     Note:  This document was prepared using Dragon voice recognition software and may include unintentional dictation errors.    Merlyn Lot, MD 11/13/18 [phone]

## 2018-11-13 NOTE — ED Triage Notes (Signed)
covid positive 7/27,c/o continued fever and fatigue. Pt alert and oriented X4, active, cooperative, pt in NAD. RR even and unlabored, color WNL.

## 2018-11-13 NOTE — ED Notes (Signed)
EDP at bedside  

## 2018-11-13 NOTE — ED Notes (Signed)
No drops in O2 sats while ambulating in room

## 2018-11-13 NOTE — ED Notes (Signed)
Pt states her symptoms have been getting worse since her test- c/o her stomach and back hurts, no appetite, and fatigue

## 2018-11-22 ENCOUNTER — Emergency Department
Admission: EM | Admit: 2018-11-22 | Discharge: 2018-11-22 | Disposition: A | Discharge status: Home / Self Care | Payer: BLUE CROSS/BLUE SHIELD | Attending: Student in an Organized Health Care Education/Training Program | Admitting: Student in an Organized Health Care Education/Training Program

## 2018-11-22 ENCOUNTER — Emergency Department: Payer: BLUE CROSS/BLUE SHIELD

## 2018-11-22 ENCOUNTER — Other Ambulatory Visit: Payer: Self-pay

## 2018-11-22 DIAGNOSIS — F17210 Nicotine dependence, cigarettes, uncomplicated: Secondary | ICD-10-CM | POA: Diagnosis not present

## 2018-11-22 DIAGNOSIS — U071 COVID-19: Secondary | ICD-10-CM | POA: Diagnosis not present

## 2018-11-22 DIAGNOSIS — Z7982 Long term (current) use of aspirin: Secondary | ICD-10-CM | POA: Diagnosis not present

## 2018-11-22 DIAGNOSIS — R0602 Shortness of breath: Secondary | ICD-10-CM | POA: Insufficient documentation

## 2018-11-22 DIAGNOSIS — M545 Low back pain: Secondary | ICD-10-CM | POA: Diagnosis present

## 2018-11-22 DIAGNOSIS — Z79899 Other long term (current) drug therapy: Secondary | ICD-10-CM | POA: Diagnosis not present

## 2018-11-22 DIAGNOSIS — M546 Pain in thoracic spine: Secondary | ICD-10-CM

## 2018-11-22 LAB — TROPONIN I (HIGH SENSITIVITY): Troponin I (High Sensitivity): 2 ng/L (ref <18)

## 2018-11-22 LAB — COMPREHENSIVE METABOLIC PANEL
ALT: 20 U/L (ref 0–44)
AST: 20 U/L (ref 15–41)
Albumin: 4.4 g/dL (ref 3.5–5.0)
Alkaline Phosphatase: 87 U/L (ref 38–126)
Anion gap: 10 (ref 5–15)
BUN: 12 mg/dL (ref 8–23)
CO2: 23 mmol/L (ref 22–32)
Calcium: 9.2 mg/dL (ref 8.9–10.3)
Chloride: 108 mmol/L (ref 98–111)
Creatinine, Ser: 0.72 mg/dL (ref 0.44–1.00)
GFR calc Af Amer: >60 mL/min (ref >60)
GFR calc non Af Amer: >60 mL/min (ref >60)
Glucose, Bld: 104 mg/dL — ABNORMAL HIGH (ref 70–99)
Potassium: 3.6 mmol/L (ref 3.5–5.1)
Sodium: 141 mmol/L (ref 135–145)
Total Bilirubin: 0.8 mg/dL (ref 0.3–1.2)
Total Protein: 7.9 g/dL (ref 6.5–8.1)

## 2018-11-22 LAB — URINALYSIS, COMPLETE (UACMP) WITH MICROSCOPIC
Bacteria, UA: NONE SEEN
Bilirubin Urine: NEGATIVE
Glucose, UA: NEGATIVE mg/dL
Hgb urine dipstick: NEGATIVE
Ketones, ur: NEGATIVE mg/dL
Leukocytes,Ua: NEGATIVE
Nitrite: NEGATIVE
Protein, ur: NEGATIVE mg/dL
Specific Gravity, Urine: 1.011 (ref 1.005–1.030)
pH: 6 (ref 5.0–8.0)

## 2018-11-22 LAB — CBC
HCT: 35.3 % — ABNORMAL LOW (ref 36.0–46.0)
Hemoglobin: 11.9 g/dL — ABNORMAL LOW (ref 12.0–15.0)
MCH: 27.9 pg (ref 26.0–34.0)
MCHC: 33.7 g/dL (ref 30.0–36.0)
MCV: 82.7 fL (ref 80.0–100.0)
Platelets: 414 10*3/uL — ABNORMAL HIGH (ref 150–400)
RBC: 4.27 MIL/uL (ref 3.87–5.11)
RDW: 13.7 % (ref 11.5–15.5)
WBC: 6.5 10*3/uL (ref 4.0–10.5)
nRBC: 0 % (ref 0.0–0.2)

## 2018-11-22 LAB — FIBRIN DERIVATIVES D-DIMER (ARMC ONLY): Fibrin derivatives D-dimer (ARMC): 1135.64 ng/mL (FEU) — ABNORMAL HIGH (ref 0.00–499.00)

## 2018-11-22 LAB — LIPASE, BLOOD: Lipase: 30 U/L (ref 11–51)

## 2018-11-22 MED ORDER — CYCLOBENZAPRINE HCL 5 MG PO TABS: 5.0000 mg | ORAL_TABLET | Freq: Three times a day (TID) | ORAL | 0 refills | Status: DC | PRN

## 2018-11-22 MED ORDER — IOHEXOL 350 MG/ML SOLN
75.0000 mL | Freq: Once | INTRAVENOUS | Status: AC | PRN
  Administered 2018-11-22: 75 mL via INTRAVENOUS
  Filled 2018-11-22: qty 75

## 2018-11-22 MED ORDER — SODIUM CHLORIDE 0.9% FLUSH: 3.0000 mL | Freq: Once | INTRAVENOUS | Status: DC

## 2018-11-22 MED ORDER — HYDROCODONE-ACETAMINOPHEN 5-325 MG PO TABS
1.0000 | ORAL_TABLET | Freq: Once | ORAL | Status: AC
  Administered 2018-11-22: 1 via ORAL
  Filled 2018-11-22: qty 1

## 2018-11-22 NOTE — ED Notes (Signed)
Patient transported to CT 

## 2018-11-22 NOTE — ED Provider Notes (Signed)
[facility] Emergency Department Provider Note    First MD Initiated Contact with Patient 11/22/18 1303     (approximate)  I have reviewed the triage vital signs and the nursing notes.   HISTORY  Chief Complaint Back Pain    HPI Shelley White is a 63 y.o. female presents the ER for evaluation of persistent low back pain associated with shortness of breath and left arm discomfort.  Patient was recently diagnosed with COVID.  States that she will have days where she feels better and then other days that are worse.  She denies any numbness or tingling.  No nausea or vomiting.  No chest pain pressure or pain.  Does have some discomfort when taking a deep inspiration.  She is not on any anticoagulation other than aspirin daily.  Denies any lower extremity swelling.    History reviewed. No pertinent past medical history. Family History  Problem Relation Age of Onset  . Breast cancer Neg Hx    Past Surgical History:  Procedure Laterality Date  . ABDOMINAL HYSTERECTOMY    . BREAST BIOPSY Right    Patient Active Problem List   Diagnosis Date Noted  . Chest pain 12/08/2017      Prior to Admission medications   Medication Sig Start Date End Date Taking? Authorizing Provider  albuterol (VENTOLIN HFA) 108 (90 Base) MCG/ACT inhaler Inhale 2 puffs into the lungs every 6 (six) hours as needed for wheezing or shortness of breath. 11/13/18   Merlyn Lot, MD  aspirin EC 81 MG tablet Take 81 mg by mouth daily.     [provider]  buPROPion (WELLBUTRIN XL) 150 MG 24 hr tablet Take 150 mg by mouth daily. 11/18/17   [provider]  Cholecalciferol (VITAMIN D-1000 MAX ST) 1000 units tablet Take 1,000 Units by mouth daily.     [provider]  cyclobenzaprine (FLEXERIL) 5 MG tablet Take 1 tablet (5 mg total) by mouth 3 (three) times daily as needed for muscle spasms. 11/22/18   Merlyn Lot, MD  Multiple Vitamin (MULTI-VITAMINS) TABS  Take 1 tablet by mouth daily.     [provider]  traMADol (ULTRAM) 50 MG tablet Take 1 tablet (50 mg total) by mouth every 8 (eight) hours as needed. Patient not taking: Reported on 12/08/2017 11/14/16   Edrick Kins, DPM    Allergies Codeine    Social History Social History   Tobacco Use  . Smoking status: Current Every Day Smoker    Packs/day: 1.00  . Smokeless tobacco: Never Used  Substance Use Topics  . Alcohol use: No  . Drug use: No    Review of Systems Patient denies headaches, rhinorrhea, blurry vision, numbness, shortness of breath, chest pain, edema, cough, abdominal pain, nausea, vomiting, diarrhea, dysuria, fevers, rashes or hallucinations unless otherwise stated above in HPI. ____________________________________________   PHYSICAL EXAM:  VITAL SIGNS: Vitals:   11/22/18 1151  BP: (!) 141/67  Pulse: 92  Resp: 18  Temp: 97.8 F (36.6 C)  SpO2: 97%    Constitutional: Alert and oriented.  Eyes: Conjunctivae are normal.  Head: Atraumatic. Nose: No congestion/rhinnorhea. Mouth/Throat: Mucous membranes are moist.   Neck: No stridor. Painless ROM.  Cardiovascular: Normal rate, regular rhythm. Grossly normal heart sounds.  Good peripheral circulation. Respiratory: Normal respiratory effort.  No retractions. Lungs CTAB. Gastrointestinal: Soft and nontender. No distention. No abdominal bruits. BLE CVA tenderness. Genitourinary:  Musculoskeletal: No lower extremity tenderness nor edema.  No joint effusions.  Neurologic:  Normal speech and language. No gross focal neurologic deficits are appreciated. No facial droop Skin:  Skin is warm, dry and intact. No rash noted. Psychiatric: Mood and affect are normal. Speech and behavior are normal.  ____________________________________________   LABS (all labs ordered are listed, but only abnormal results are displayed)  Results for orders placed or performed during the hospital encounter of 11/22/18 (from  the past 24 hour(s))  Urinalysis, Complete w Microscopic     Status: Abnormal   Collection Time: 11/22/18 11:59 AM  Result Value Ref Range   Color, Urine YELLOW (A) YELLOW   APPearance CLEAR (A) CLEAR   Specific Gravity, Urine 1.011 1.005 - 1.030   pH 6.0 5.0 - 8.0   Glucose, UA NEGATIVE NEGATIVE mg/dL   Hgb urine dipstick NEGATIVE NEGATIVE   Bilirubin Urine NEGATIVE NEGATIVE   Ketones, ur NEGATIVE NEGATIVE mg/dL   Protein, ur NEGATIVE NEGATIVE mg/dL   Nitrite NEGATIVE NEGATIVE   Leukocytes,Ua NEGATIVE NEGATIVE   WBC, UA 0-5 0 - 5 WBC/hpf   Bacteria, UA NONE SEEN NONE SEEN   Squamous Epithelial / LPF 0-5 0 - 5   Mucus PRESENT   Lipase, blood     Status: None   Collection Time: 11/22/18 12:01 PM  Result Value Ref Range   Lipase 30 11 - 51 U/L  Comprehensive metabolic panel     Status: Abnormal   Collection Time: 11/22/18 12:01 PM  Result Value Ref Range   Sodium 141 135 - 145 mmol/L   Potassium 3.6 3.5 - 5.1 mmol/L   Chloride 108 98 - 111 mmol/L   CO2 23 22 - 32 mmol/L   Glucose, Bld 104 (H) 70 - 99 mg/dL   BUN 12 8 - 23 mg/dL   Creatinine, Ser 0.72 0.44 - 1.00 mg/dL   Calcium 9.2 8.9 - 10.3 mg/dL   Total Protein 7.9 6.5 - 8.1 g/dL   Albumin 4.4 3.5 - 5.0 g/dL   AST 20 15 - 41 U/L   ALT 20 0 - 44 U/L   Alkaline Phosphatase 87 38 - 126 U/L   Total Bilirubin 0.8 0.3 - 1.2 mg/dL   GFR calc non Af Amer >60 >60 mL/min   GFR calc Af Amer >60 >60 mL/min   Anion gap 10 5 - 15  CBC     Status: Abnormal   Collection Time: 11/22/18 12:01 PM  Result Value Ref Range   WBC 6.5 4.0 - 10.5 K/uL   RBC 4.27 3.87 - 5.11 MIL/uL   Hemoglobin 11.9 (L) 12.0 - 15.0 g/dL   HCT 35.3 (L) 36.0 - 46.0 %   MCV 82.7 80.0 - 100.0 fL   MCH 27.9 26.0 - 34.0 pg   MCHC 33.7 30.0 - 36.0 g/dL   RDW 13.7 11.5 - 15.5 %   Platelets 414 (H) 150 - 400 K/uL   nRBC 0.0 0.0 - 0.2 %  Fibrin derivatives D-Dimer ([facility] only)     Status: Abnormal   Collection Time: 11/22/18 12:01 PM  Result Value Ref Range    Fibrin derivatives D-dimer ([facility]) 1,135.64 (H) 0.00 - 499.00 ng/mL (FEU)  Troponin I (High Sensitivity)     Status: None   Collection Time: 11/22/18 12:01 PM  Result Value Ref Range   Troponin I (High Sensitivity) 2 <18 ng/L   ____________________________________________  EKG My review and personal interpretation at Time: 13:33   Indication: back pain  Rate: 80  Rhythm: sinus Axis: normal Other: normal intervals, no  stemi ____________________________________________  RADIOLOGY  I personally reviewed all radiographic images ordered to evaluate for the above acute complaints and reviewed radiology reports and findings.  These findings were personally discussed with the patient.  Please see medical record for radiology report.  ____________________________________________   PROCEDURES  Procedure(s) performed:  Procedures    Critical Care performed: no ____________________________________________   INITIAL IMPRESSION / ASSESSMENT AND PLAN / ED COURSE  Pertinent labs & imaging results that were available during my care of the patient were reviewed by me and considered in my medical decision making (see chart for details).   DDX: pna, chf, ptx, covid, pleurisy, pe, pyelo, stone  Jianni HARGUN SPURLING is a 63 y.o. who presents to the ED with symptoms as described above.  She is well-appearing in no acute distress.  Presentation complicated by recent COVID illness.  Blood work was sent for the by differential.  Does not seem clinically consistent with ACS but will order enzyme EKG appears normal.  She is low risk by [place] but will order d-dimer to further re-stratify for PE.   Clinical Course as of Nov 21 1552  Mon Nov 22, 2018  1400 D-dimer is elevated.  No infiltrates on chest x-ray.  Given the high risk of thrombotic components with COVID and her presentation will order CT angiogram to evaluate for PE.   [PR]  1551 Patient reassessed.  Feels well.  CT imaging is reassuring.  Does  not seem clinically consistent with ACS.  Her abdominal exam soft and benign.  No evidence of UTI or Pyelo.  Likely pleurisy or musculoskeletal strain.  Will prescribe Flexeril and discussed follow-up with PCP.   [PR]    Clinical Course User Index [PR] Merlyn Lot, MD    The patient was evaluated in Emergency Department today for the symptoms described in the history of present illness. He/she was evaluated in the context of the global COVID-19 pandemic, which necessitated consideration that the patient might be at risk for infection with the SARS-CoV-2 virus that causes COVID-19. Institutional protocols and algorithms that pertain to the evaluation of patients at risk for COVID-19 are in a state of rapid change based on information released by regulatory bodies including the CDC and federal and state organizations. These policies and algorithms were followed during the patient's care in the ED.  As part of my medical decision making, I reviewed the following data within the [place] notes reviewed and incorporated, Labs reviewed, notes from prior ED visits and [place] Controlled Substance Database   ____________________________________________   FINAL CLINICAL IMPRESSION(S) / ED DIAGNOSES  Final diagnoses:  Acute bilateral thoracic back pain      NEW MEDICATIONS STARTED DURING THIS VISIT:  New Prescriptions   CYCLOBENZAPRINE (FLEXERIL) 5 MG TABLET    Take 1 tablet (5 mg total) by mouth 3 (three) times daily as needed for muscle spasms.     Note:  This document was prepared using Dragon voice recognition software and may include unintentional dictation errors.    Merlyn Lot, MD 11/22/18 [phone]

## 2018-11-22 NOTE — ED Notes (Signed)
Reports meds here helped out

## 2018-11-22 NOTE — ED Triage Notes (Signed)
Pt comes via POV from home with c/o lower back pain. Pt states this started after she was diagnosed with COVID on July 24th.  Pt states it gets better and then comes back and gets bad again. Pt denies any recent falls or injuries.  Pt denies any CP or SOB.  Pt states she was seen here on the 1st and is still experiencing flank pain, dysuria and nonproductive cough.  Respirations even and unlabored. Pt in NAD at this time

## 2019-01-03 DIAGNOSIS — R319 Hematuria, unspecified: Secondary | ICD-10-CM | POA: Insufficient documentation

## 2019-03-30 ENCOUNTER — Other Ambulatory Visit: Payer: Self-pay

## 2019-03-30 ENCOUNTER — Ambulatory Visit: Payer: Medicare PPO | Attending: Internal Medicine

## 2019-03-30 DIAGNOSIS — Z20828 Contact with and (suspected) exposure to other viral communicable diseases: Secondary | ICD-10-CM | POA: Insufficient documentation

## 2019-03-30 DIAGNOSIS — Z20822 Contact with and (suspected) exposure to covid-19: Secondary | ICD-10-CM

## 2019-03-31 LAB — NOVEL CORONAVIRUS, NAA: SARS-CoV-2, NAA: NOT DETECTED

## 2019-03-31 NOTE — Progress Notes (Signed)
Order(s) created erroneously. Erroneous order ID: [id number]  Order moved by: Brigitte Pulse  Order move date/time: 03/31/2019 12:50 PM  Source Patient: [id number]  Source Contact: 03/30/2019  Destination Patient: [id number]  Destination Contact: 03/30/2019

## 2019-03-31 NOTE — Progress Notes (Signed)
Order(s) created erroneously. Erroneous order ID: [id number]  Order moved by: Brigitte Pulse  Order move date/time: 03/31/2019 12:50 PM  Source Patient: [id number]  Source Contact: 03/30/2019  Destination Patient: [id number]  Destination Contact: 03/30/2019

## 2019-03-31 NOTE — Progress Notes (Signed)
Moving orders to this encounter.  

## 2019-04-28 ENCOUNTER — Emergency Department
Admission: EM | Admit: 2019-04-28 | Discharge: 2019-04-28 | Disposition: A | Discharge status: Home / Self Care | Payer: Medicare HMO | Attending: Student | Admitting: Student

## 2019-04-28 ENCOUNTER — Other Ambulatory Visit: Payer: Self-pay

## 2019-04-28 ENCOUNTER — Encounter: Payer: Self-pay | Admitting: Emergency Medicine

## 2019-04-28 DIAGNOSIS — R0789 Other chest pain: Secondary | ICD-10-CM | POA: Diagnosis not present

## 2019-04-28 DIAGNOSIS — Z7982 Long term (current) use of aspirin: Secondary | ICD-10-CM | POA: Insufficient documentation

## 2019-04-28 DIAGNOSIS — Z0130 Encounter for examination of blood pressure without abnormal findings: Secondary | ICD-10-CM | POA: Diagnosis not present

## 2019-04-28 DIAGNOSIS — R531 Weakness: Secondary | ICD-10-CM | POA: Diagnosis present

## 2019-04-28 DIAGNOSIS — Z79899 Other long term (current) drug therapy: Secondary | ICD-10-CM | POA: Insufficient documentation

## 2019-04-28 DIAGNOSIS — F17210 Nicotine dependence, cigarettes, uncomplicated: Secondary | ICD-10-CM | POA: Diagnosis not present

## 2019-04-28 LAB — URINALYSIS, COMPLETE (UACMP) WITH MICROSCOPIC
Bilirubin Urine: NEGATIVE
Glucose, UA: NEGATIVE mg/dL
Hgb urine dipstick: NEGATIVE
Ketones, ur: NEGATIVE mg/dL
Leukocytes,Ua: NEGATIVE
Nitrite: NEGATIVE
Protein, ur: NEGATIVE mg/dL
Specific Gravity, Urine: 1.012 (ref 1.005–1.030)
pH: 6 (ref 5.0–8.0)

## 2019-04-28 LAB — CBC
HCT: 38.5 % (ref 36.0–46.0)
Hemoglobin: 12.7 g/dL (ref 12.0–15.0)
MCH: 28 pg (ref 26.0–34.0)
MCHC: 33 g/dL (ref 30.0–36.0)
MCV: 85 fL (ref 80.0–100.0)
Platelets: 279 10*3/uL (ref 150–400)
RBC: 4.53 MIL/uL (ref 3.87–5.11)
RDW: 13.3 % (ref 11.5–15.5)
WBC: 5.1 10*3/uL (ref 4.0–10.5)
nRBC: 0 % (ref 0.0–0.2)

## 2019-04-28 LAB — BASIC METABOLIC PANEL
Anion gap: 9 (ref 5–15)
BUN: 11 mg/dL (ref 8–23)
CO2: 26 mmol/L (ref 22–32)
Calcium: 9.3 mg/dL (ref 8.9–10.3)
Chloride: 107 mmol/L (ref 98–111)
Creatinine, Ser: 0.88 mg/dL (ref 0.44–1.00)
GFR calc Af Amer: >60 mL/min (ref >60)
GFR calc non Af Amer: >60 mL/min (ref >60)
Glucose, Bld: 96 mg/dL (ref 70–99)
Potassium: 3.5 mmol/L (ref 3.5–5.1)
Sodium: 142 mmol/L (ref 135–145)

## 2019-04-28 LAB — TROPONIN I (HIGH SENSITIVITY): Troponin I (High Sensitivity): <2 ng/L (ref <18)

## 2019-04-28 MED ORDER — ASPIRIN 81 MG PO CHEW
324.0000 mg | CHEWABLE_TABLET | Freq: Once | ORAL | Status: AC
  Administered 2019-04-28: 12:00:00 324 mg via ORAL
  Filled 2019-04-28: qty 4

## 2019-04-28 MED ORDER — SODIUM CHLORIDE 0.9% FLUSH: 3.0000 mL | Freq: Once | INTRAVENOUS | Status: DC

## 2019-04-28 NOTE — Discharge Instructions (Addendum)
Thank you for letting us take care of you in the emergency department today.   Please continue to take any regular, prescribed medications.   Please follow up with: - Your primary care doctor to review your ER visit and follow up on your symptoms.  - A cardiology (heart doctor), information for one is below  Please return to the ER for any new or worsening symptoms.

## 2019-04-28 NOTE — ED Provider Notes (Signed)
[facility] Emergency Department Provider Note  ____________________________________________   First MD Initiated Contact with Patient 04/28/19 1031     (approximate)  I have reviewed the triage vital signs and the nursing notes.  History  Chief Complaint Weakness    HPI Shelley White is a 64 y.o. female with hx of anxiety, GERD who presents to the ED with concern for high BP, chest discomfort, and arm heaviness.   Patient states she took her blood pressure at home yesterday and was concerned because it was elevated, with systolics in the [id number].  She has no history of high blood pressure, and therefore became concerned about this.  She had a few other repeat readings that were elevated as well.  On arrival here initial BP 150/77, repeat normal at 139/84.  Yesterday, around this time she developed a central chest discomfort.  She describes this as an "anxiety like" feeling in the center of her chest.  Mild in severity, constant since onset, no radiation, no alleviating or aggravating factors.  She has some associated "heaviness" of her left arm.  She does note a history of arthritis in that extremity, but states this sensation feels slightly different.  Does smoke tobacco, but denies any history of HTN, HLD, DM, or family history of cardiac disease.   Past Medical Hx History reviewed. No pertinent past medical history.  Problem List Patient Active Problem List   Diagnosis Date Noted  . Chest pain 12/08/2017    Past Surgical Hx Past Surgical History:  Procedure Laterality Date  . ABDOMINAL HYSTERECTOMY    . BREAST BIOPSY Right     Medications Prior to Admission medications   Medication Sig Start Date End Date Taking? Authorizing Provider  albuterol (VENTOLIN HFA) 108 (90 Base) MCG/ACT inhaler Inhale 2 puffs into the lungs every 6 (six) hours as needed for wheezing or shortness of breath. 11/13/18   Merlyn Lot, MD  aspirin EC 81 MG tablet Take  81 mg by mouth daily.     [provider]  buPROPion (WELLBUTRIN XL) 150 MG 24 hr tablet Take 150 mg by mouth daily. 11/18/17   [provider]  Cholecalciferol (VITAMIN D-1000 MAX ST) 1000 units tablet Take 1,000 Units by mouth daily.     [provider]  cyclobenzaprine (FLEXERIL) 5 MG tablet Take 1 tablet (5 mg total) by mouth 3 (three) times daily as needed for muscle spasms. 11/22/18   Merlyn Lot, MD  Multiple Vitamin (MULTI-VITAMINS) TABS Take 1 tablet by mouth daily.     [provider]  traMADol (ULTRAM) 50 MG tablet Take 1 tablet (50 mg total) by mouth every 8 (eight) hours as needed. Patient not taking: Reported on 12/08/2017 11/14/16   Edrick Kins, DPM    Allergies Codeine  Family Hx Family History  Problem Relation Age of Onset  . Breast cancer Neg Hx     Social Hx Social History   Tobacco Use  . Smoking status: Current Every Day Smoker    Packs/day: 1.00  . Smokeless tobacco: Never Used  Substance Use Topics  . Alcohol use: No  . Drug use: No     Review of Systems  Constitutional: Negative for fever, chills. Eyes: Negative for visual changes. ENT: Negative for sore throat. Cardiovascular: + chest discomfort Respiratory: Negative for shortness of breath. Gastrointestinal: Negative for nausea, vomiting.  Genitourinary: Negative for dysuria. Musculoskeletal: Negative for leg swelling. Skin: Negative for rash. Neurological: Negative for for headaches.  Physical Exam  Vital Signs: ED Triage Vitals  Enc Vitals Group     BP 04/28/19 0859 (!) 150/77     Pulse Rate 04/28/19 0859 86     Resp 04/28/19 0859 18     Temp 04/28/19 0859 98.9 F (37.2 C)     Temp Source 04/28/19 0859 Oral     SpO2 04/28/19 0859 97 %     Weight 04/28/19 0901 158 lb (71.7 kg)     Height 04/28/19 0901 5\' 6"  (1.676 m)     Head Circumference --      Peak Flow --      Pain Score 04/28/19 0901 0     Pain Loc --      Pain Edu? --      Excl.  in [place]? --     Constitutional: Alert and oriented.  Head: Normocephalic. Atraumatic. Eyes: Conjunctivae clear. Sclera anicteric. Nose: No congestion. No rhinorrhea. Mouth/Throat: Wearing mask.  Neck: No stridor.   Cardiovascular: Normal rate, regular rhythm. Extremities well perfused. Respiratory: Normal respiratory effort.  Lungs CTAB. Gastrointestinal: Soft. Non-tender. Non-distended.  Musculoskeletal: No lower extremity edema. No deformities. Neurologic:  Normal speech and language. No gross focal neurologic deficits are appreciated.  Skin: Skin is warm, dry and intact. No rash noted. Psychiatric: Mood and affect are appropriate for situation.  EKG  Personally reviewed.   Rate: 83 Rhythm: sinus Axis: normal Intervals: WNL No acute ischemic changes No STEMI    Procedures  Procedure(s) performed (including critical care):  Procedures   Initial Impression / Assessment and Plan / ED Course  64 y.o. female who presents to the ED with concern for high BP, atypical chest discomfort, as above.  Ddx: atypical ACS, GERD, anxiety.  Initial blood pressure on arrival mildly elevated at 150/77, however on repeat, multiple blood pressure readings unremarkable, most recently 130s/80s.  Will obtain labs, EKG. ASA.  EKG w/o acute ischemic changes. Troponin negative. Given onset of symptoms greater than 12 hours ago, HS troponin <2, do not feel delta trop is necessary. Low risk HEART score.  As such, patient stable for discharge.  Advised PCP and cardiology follow-up.  Patient voices understanding and is comfortable with discharge at this time. Given return precautions.   Final Clinical Impression(s) / ED Diagnosis  Final diagnoses:  Examination of blood pressure  Chest discomfort       Note:  This document was prepared using Dragon voice recognition software and may include unintentional dictation errors.   Lilia Pro., MD 04/28/19 [phone]

## 2019-04-28 NOTE — ED Notes (Signed)
While in triage, pt c/o slight headache urinary frequency as well.

## 2019-04-28 NOTE — ED Triage Notes (Signed)
Pt here from home with c/o weakness for the past 3 days, took her bp yesterday and said it was very low, then she rechecked it and it was high. Does not take bp meds. Walked to triage with no issues, denies cough or fever. Has been having left upper arm pain recently as well.

## 2019-04-28 NOTE — ED Notes (Signed)
Per dr Royden Purl - timed troponin, saline lock are not needed and can be cancelled.  Cardiac monitoring can be discontinued.

## 2019-05-04 DIAGNOSIS — M25512 Pain in left shoulder: Secondary | ICD-10-CM | POA: Diagnosis not present

## 2019-05-04 DIAGNOSIS — M898X1 Other specified disorders of bone, shoulder: Secondary | ICD-10-CM | POA: Diagnosis not present

## 2019-05-04 DIAGNOSIS — M79622 Pain in left upper arm: Secondary | ICD-10-CM | POA: Diagnosis not present

## 2019-05-04 DIAGNOSIS — Z7189 Other specified counseling: Secondary | ICD-10-CM | POA: Diagnosis not present

## 2019-05-04 DIAGNOSIS — Z23 Encounter for immunization: Secondary | ICD-10-CM | POA: Diagnosis not present

## 2019-05-04 DIAGNOSIS — F411 Generalized anxiety disorder: Secondary | ICD-10-CM | POA: Diagnosis not present

## 2019-05-04 DIAGNOSIS — F17200 Nicotine dependence, unspecified, uncomplicated: Secondary | ICD-10-CM | POA: Diagnosis not present

## 2019-05-04 DIAGNOSIS — M25522 Pain in left elbow: Secondary | ICD-10-CM | POA: Diagnosis not present

## 2019-05-04 DIAGNOSIS — Z1389 Encounter for screening for other disorder: Secondary | ICD-10-CM | POA: Diagnosis not present

## 2019-06-08 DIAGNOSIS — F411 Generalized anxiety disorder: Secondary | ICD-10-CM | POA: Diagnosis not present

## 2019-06-08 DIAGNOSIS — Z1389 Encounter for screening for other disorder: Secondary | ICD-10-CM | POA: Diagnosis not present

## 2019-06-21 ENCOUNTER — Other Ambulatory Visit: Payer: Self-pay

## 2019-06-21 ENCOUNTER — Emergency Department
Admission: EM | Admit: 2019-06-21 | Discharge: 2019-06-21 | Disposition: A | Discharge status: Home / Self Care | Payer: Medicare HMO | Attending: Emergency Medicine | Admitting: Emergency Medicine

## 2019-06-21 ENCOUNTER — Encounter: Payer: Self-pay | Admitting: *Deleted

## 2019-06-21 DIAGNOSIS — Z7982 Long term (current) use of aspirin: Secondary | ICD-10-CM | POA: Insufficient documentation

## 2019-06-21 DIAGNOSIS — R310 Gross hematuria: Secondary | ICD-10-CM | POA: Insufficient documentation

## 2019-06-21 DIAGNOSIS — Z1389 Encounter for screening for other disorder: Secondary | ICD-10-CM | POA: Diagnosis not present

## 2019-06-21 DIAGNOSIS — R319 Hematuria, unspecified: Secondary | ICD-10-CM | POA: Diagnosis present

## 2019-06-21 DIAGNOSIS — F17210 Nicotine dependence, cigarettes, uncomplicated: Secondary | ICD-10-CM | POA: Insufficient documentation

## 2019-06-21 DIAGNOSIS — Z130 Encounter for screening for diseases of the blood and blood-forming organs and certain disorders involving the immune mechanism: Secondary | ICD-10-CM | POA: Diagnosis not present

## 2019-06-21 LAB — URINALYSIS, COMPLETE (UACMP) WITH MICROSCOPIC
Bacteria, UA: NONE SEEN
Bilirubin Urine: NEGATIVE
Glucose, UA: NEGATIVE mg/dL
Ketones, ur: NEGATIVE mg/dL
Leukocytes,Ua: NEGATIVE
Nitrite: NEGATIVE
Protein, ur: NEGATIVE mg/dL
RBC / HPF: >50 RBC/hpf — ABNORMAL HIGH (ref 0–5)
Specific Gravity, Urine: 1.01 (ref 1.005–1.030)
Squamous Epithelial / HPF: NONE SEEN (ref 0–5)
pH: 6 (ref 5.0–8.0)

## 2019-06-21 LAB — CBC
HCT: 38.4 % (ref 36.0–46.0)
Hemoglobin: 12.9 g/dL (ref 12.0–15.0)
MCH: 28.6 pg (ref 26.0–34.0)
MCHC: 33.6 g/dL (ref 30.0–36.0)
MCV: 85.1 fL (ref 80.0–100.0)
Platelets: 294 10*3/uL (ref 150–400)
RBC: 4.51 MIL/uL (ref 3.87–5.11)
RDW: 13.2 % (ref 11.5–15.5)
WBC: 6.4 10*3/uL (ref 4.0–10.5)
nRBC: 0 % (ref 0.0–0.2)

## 2019-06-21 LAB — COMPREHENSIVE METABOLIC PANEL
ALT: 16 U/L (ref 0–44)
AST: 18 U/L (ref 15–41)
Albumin: 4.4 g/dL (ref 3.5–5.0)
Alkaline Phosphatase: 73 U/L (ref 38–126)
Anion gap: 11 (ref 5–15)
BUN: 12 mg/dL (ref 8–23)
CO2: 25 mmol/L (ref 22–32)
Calcium: 9.3 mg/dL (ref 8.9–10.3)
Chloride: 105 mmol/L (ref 98–111)
Creatinine, Ser: 0.84 mg/dL (ref 0.44–1.00)
GFR calc Af Amer: >60 mL/min (ref >60)
GFR calc non Af Amer: >60 mL/min (ref >60)
Glucose, Bld: 96 mg/dL (ref 70–99)
Potassium: 3.4 mmol/L — ABNORMAL LOW (ref 3.5–5.1)
Sodium: 141 mmol/L (ref 135–145)
Total Bilirubin: 0.5 mg/dL (ref 0.3–1.2)
Total Protein: 7.6 g/dL (ref 6.5–8.1)

## 2019-06-21 NOTE — ED Notes (Signed)
First Nurse Note:  This RN received call from [facility] due to ongoing hematuria x one week.  Clinic expressed need for further evaluation due to Hgb 10 noted in todays blood work.  Pt alert, ambulatory to triage upon arrival, NAD noted.

## 2019-06-21 NOTE — ED Provider Notes (Signed)
[facility] Emergency Department Provider Note   ____________________________________________    I have reviewed the triage vital signs and the nursing notes.   HISTORY  Chief Complaint Hematuria     HPI Shelley White is a 64 y.o. female who presents with complaints of hematuria.  Patient reports she has had hematuria for over a week, she thought maybe it was related to a urinary tract infection and saw her PCP today.  They referred her to the emergency department because they note that her blood work demonstrate that she was anemic.  Overall she reports that she feels well.  She denies abdominal pain.  No fevers or chills.  No flank pain.  No nausea or vomiting no history of bleeding dyscrasia.  No past medical history on file.  Patient Active Problem List   Diagnosis Date Noted  . Chest pain 12/08/2017    Past Surgical History:  Procedure Laterality Date  . ABDOMINAL HYSTERECTOMY    . BREAST BIOPSY Right     Prior to Admission medications   Medication Sig Start Date End Date Taking? Authorizing Provider  albuterol (VENTOLIN HFA) 108 (90 Base) MCG/ACT inhaler Inhale 2 puffs into the lungs every 6 (six) hours as needed for wheezing or shortness of breath. 11/13/18   Merlyn Lot, MD  aspirin EC 81 MG tablet Take 81 mg by mouth daily.     [provider]  buPROPion (WELLBUTRIN XL) 150 MG 24 hr tablet Take 150 mg by mouth daily. 11/18/17   [provider]  Cholecalciferol (VITAMIN D-1000 MAX ST) 1000 units tablet Take 1,000 Units by mouth daily.     [provider]  cyclobenzaprine (FLEXERIL) 5 MG tablet Take 1 tablet (5 mg total) by mouth 3 (three) times daily as needed for muscle spasms. 11/22/18   Merlyn Lot, MD  Multiple Vitamin (MULTI-VITAMINS) TABS Take 1 tablet by mouth daily.     [provider]  traMADol (ULTRAM) 50 MG tablet Take 1 tablet (50 mg total) by mouth every 8 (eight) hours as  needed. Patient not taking: Reported on 12/08/2017 11/14/16   Edrick Kins, DPM     Allergies Codeine  Family History  Problem Relation Age of Onset  . Breast cancer Neg Hx     Social History Social History   Tobacco Use  . Smoking status: Current Every Day Smoker    Packs/day: 1.00  . Smokeless tobacco: Never Used  Substance Use Topics  . Alcohol use: No  . Drug use: No    Review of Systems  Constitutional: No fever/chills  Cardiovascular: Denies chest pain. Respiratory: Denies shortness of breath. Gastrointestinal: As above Genitourinary: Negative for dysuria.  Hematuria as above Musculoskeletal: Negative for back pain. Skin: Negative for rash.   ____________________________________________   PHYSICAL EXAM:  VITAL SIGNS: ED Triage Vitals  Enc Vitals Group     BP 06/21/19 1656 (!) 151/88     Pulse Rate 06/21/19 1656 84     Resp 06/21/19 1656 18     Temp 06/21/19 1656 98.7 F (37.1 C)     Temp Source 06/21/19 1656 Oral     SpO2 06/21/19 1656 99 %     Weight 06/21/19 1654 72.1 kg (159 lb)     Height 06/21/19 1654 1.676 m (5\' 6" )     Head Circumference --      Peak Flow --      Pain Score 06/21/19 1654 2     Pain  Loc --      Pain Edu? --      Excl. in [place]? --     Constitutional: Alert and oriented.  Nose: No congestion/rhinnorhea. Mouth/Throat: Mucous membranes are moist.    Cardiovascular: Normal rate, regular rhythm.   Good peripheral circulation. Respiratory: Normal respiratory effort.  No retractions.  Gastrointestinal: Soft and nontender. No distention.  No CVA tenderness.  Musculoskeletal:  Warm and well perfused Neurologic:  Normal speech and language. No gross focal neurologic deficits are appreciated.  Skin:  Skin is warm, dry and intact. No rash noted. Psychiatric: Mood and affect are normal. Speech and behavior are normal.  ____________________________________________   LABS (all labs ordered are listed, but only abnormal results  are displayed)  Labs Reviewed  COMPREHENSIVE METABOLIC PANEL - Abnormal; Notable for the following components:      Result Value   Potassium 3.4 (*)    All other components within normal limits  URINALYSIS, COMPLETE (UACMP) WITH MICROSCOPIC - Abnormal; Notable for the following components:   Color, Urine YELLOW (*)    APPearance CLEAR (*)    Hgb urine dipstick LARGE (*)    RBC / HPF >50 (*)    All other components within normal limits  CBC   ____________________________________________  EKG  None ____________________________________________  RADIOLOGY   ____________________________________________   PROCEDURES  Procedure(s) performed: No  Procedures   Critical Care performed: No ____________________________________________   INITIAL IMPRESSION / ASSESSMENT AND PLAN / ED COURSE  Pertinent labs & imaging results that were available during my care of the patient were reviewed by me and considered in my medical decision making (see chart for details).  Patient well-appearing and in no acute distress, she is asymptomatic at this time.  Urinalysis does demonstrate large hemoglobin however no evidence of infection.  Blood work here is reassuring, hemoglobin is 12.9, this is in keeping with prior levels, no evidence of significant blood loss, appropriate for discharge with outpatient follow-up closely with urology for further evaluation.    ____________________________________________   FINAL CLINICAL IMPRESSION(S) / ED DIAGNOSES  Final diagnoses:  Gross hematuria        Note:  This document was prepared using Dragon voice recognition software and may include unintentional dictation errors.   Lavonia Drafts, MD 06/21/19 [phone]

## 2019-06-21 NOTE — ED Triage Notes (Signed)
Pt reports blood in urine for 1 week.  Worse today and pt say her doctor.  Pt was sent to the ER for eval.  Pt has lower back pain.   Pt has urinary frequency.  Pt alert  Speech clear.

## 2019-06-28 ENCOUNTER — Encounter: Payer: Self-pay | Admitting: Urology

## 2019-06-28 ENCOUNTER — Other Ambulatory Visit: Payer: Self-pay

## 2019-06-28 ENCOUNTER — Other Ambulatory Visit
Admission: RE | Admit: 2019-06-28 | Discharge: 2019-06-28 | Disposition: A | Discharge status: Home / Self Care | Payer: Medicare HMO | Source: Home / Self Care | Attending: Urology | Admitting: Urology

## 2019-06-28 ENCOUNTER — Ambulatory Visit
Admission: RE | Admit: 2019-06-28 | Discharge: 2019-06-28 | Disposition: A | Discharge status: Home / Self Care | Payer: Medicare HMO | Source: Ambulatory Visit | Attending: Urology | Admitting: Urology

## 2019-06-28 ENCOUNTER — Ambulatory Visit: Payer: Medicare HMO | Admitting: Urology

## 2019-06-28 VITALS — BP 142/74 | HR 87 | Ht 66.0 in | Wt 159.0 lb

## 2019-06-28 DIAGNOSIS — N200 Calculus of kidney: Secondary | ICD-10-CM | POA: Diagnosis not present

## 2019-06-28 DIAGNOSIS — R310 Gross hematuria: Secondary | ICD-10-CM | POA: Insufficient documentation

## 2019-06-28 LAB — URINALYSIS, COMPLETE (UACMP) WITH MICROSCOPIC
Bacteria, UA: NONE SEEN
Bilirubin Urine: NEGATIVE
Glucose, UA: NEGATIVE mg/dL
Ketones, ur: NEGATIVE mg/dL
Leukocytes,Ua: NEGATIVE
Nitrite: NEGATIVE
Protein, ur: NEGATIVE mg/dL
RBC / HPF: >50 RBC/hpf (ref 0–5)
Specific Gravity, Urine: 1.02 (ref 1.005–1.030)
Squamous Epithelial / HPF: NONE SEEN (ref 0–5)
pH: 7 (ref 5.0–8.0)

## 2019-06-28 MED ORDER — IOHEXOL 300 MG/ML  SOLN
150.0000 mL | Freq: Once | INTRAMUSCULAR | Status: AC | PRN
  Administered 2019-06-28: 125 mL via INTRAVENOUS

## 2019-06-28 NOTE — Progress Notes (Addendum)
   06/28/19 8:39 AM   Amado Nash Blima Dessert [birth year] [id number]  CC: Gross hematuria   HPI: I saw Ms. Schoonmaker in urology clinic today for evaluation of gross hematuria.  She is a relatively healthy 64 year old female with past medical history notable for anxiety and GERD who reports approximately 3 to 4 weeks of intermittent gross hematuria with pink to light red urine.  She also has noted some specks of blood when she wipes after urinating.  She was recently seen in the ED on 06/21/2019 for gross hematuria and labs were benign at that time aside from greater than 50 RBCs on urinalysis.  There was no other evidence of infection.  No imaging was performed at that visit.  She denies any urinary symptoms of dysuria or foul-smelling urine.  She has a long-term mild to moderate urge incontinence and wears a pad for this.  She has some vague mild low abdominal pain and low back pain.  Her surgical history is notable for a hysterectomy over 20 years ago, she is unsure if this was vaginal, open, or laparoscopic.  She has a 30-pack-year smoking history and continues to smoke 1 pack a day.  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  Urinalysis today with  > 50 RBCs, 0-5 WBCs, no bacteria, nitrite negative, no leukocytes.  Renal function is normal with creatinine of 0.84, EGFR greater than 60.  There is no recent prior cross-sectional abdominal imaging to review.  PMH: Anxiety GERD  Surgical History: Past Surgical History:  Procedure Laterality Date  . ABDOMINAL HYSTERECTOMY    . BREAST BIOPSY Right     Family History: Family History  Problem Relation Age of Onset  . Breast cancer Neg Hx     Social History:  reports that she has been smoking. She has been smoking about 1.00 pack per day. She has never used smokeless tobacco. She reports that she does not drink alcohol or use drugs.  Physical Exam: BP (!) 142/74 (BP Location: Left Arm,  Patient Position: Sitting, Cuff Size: Normal)   Pulse 87   Ht '5\' 6"'$  (1.676 m)   Wt 159 lb (72.1 kg)   BMI 25.66 kg/m    Constitutional:  Alert and oriented, No acute distress.  Laboratory Data: Reviewed, see HPI  Pertinent Imaging: None to review  Assessment & Plan:   In summary, she is a healthy 64 year old female with 1 month of intermittent gross hematuria.  She has no evidence of infection on urinalysis today, with persistent microscopic hematuria with greater than 50 RBCs.  Risk factors include 30-pack-year smoking history and prior work in the [employer].  We discussed common possible etiologies of hematuria including malignancy, urolithiasis, medical renal disease, and idiopathic. Standard workup recommended by the [facility] includes imaging with CT urogram to assess the upper tracts, and cystoscopy. Cytology is performed on patient's with gross hematuria to look for malignant cells in the urine.  Schedule CT urogram and cystoscopy for gross hematuria work-up Pelvic exam at time of cystoscopy  ADDENDUM: CT urogram today shows possible small right upper pole filling defect.  We discussed these findings at length.  No evidence of metastatic disease or obvious bladder filling defect.  We will plan for cystoscopy, right diagnostic ureteroscopy, possible biopsy/laser ablation next week.  Risks and benefits discussed at length.  Nickolas Madrid, MD 06/28/2019  [facility] [address] [place], [place] [address] [phone]

## 2019-06-28 NOTE — Patient Instructions (Signed)

## 2019-07-05 ENCOUNTER — Other Ambulatory Visit: Payer: Self-pay | Admitting: Urology

## 2019-07-05 ENCOUNTER — Other Ambulatory Visit: Payer: Self-pay

## 2019-07-05 ENCOUNTER — Encounter
Admission: RE | Admit: 2019-07-05 | Discharge: 2019-07-05 | Disposition: A | Discharge status: Home / Self Care | Payer: Medicare HMO | Source: Ambulatory Visit | Attending: Urology | Admitting: Urology

## 2019-07-05 ENCOUNTER — Other Ambulatory Visit: Payer: Medicare HMO | Admitting: Urology

## 2019-07-05 DIAGNOSIS — N2889 Other specified disorders of kidney and ureter: Secondary | ICD-10-CM

## 2019-07-05 HISTORY — DX: Unspecified osteoarthritis, unspecified site: M19.90

## 2019-07-05 HISTORY — DX: Depression, unspecified: F32.A

## 2019-07-05 HISTORY — DX: Gastro-esophageal reflux disease without esophagitis: K21.9

## 2019-07-05 NOTE — Patient Instructions (Signed)
Your procedure is scheduled on: 07-08-19 FRIDAY Report to Same Day Surgery 2nd floor medical mall [facility] Entrance-take elevator on left to 2nd floor.  Check in with surgery information desk.) To find out your arrival time please call [phone] between 1PM - 3PM on 07-07-19 THURSDAY  Remember: Instructions that are not followed completely may result in serious medical risk, up to and including death, or upon the discretion of your surgeon and anesthesiologist your surgery may need to be rescheduled.    _x___ 1. Do not eat food after midnight the night before your procedure. NO GUM OR CANDY AFTER MIDNIGHT. You may drink clear liquids up to 2 hours before you are scheduled to arrive at the hospital for your procedure.  Do not drink clear liquids within 2 hours of your scheduled arrival to the hospital.  Clear liquids include  --Water or Apple juice without pulp  --Gatorade  --Black Coffee or Clear Tea (No milk, no creamers, do not add anything to the coffee or Tea   ____Ensure clear carbohydrate drink on the way to the hospital for bariatric patients  ____Ensure clear carbohydrate drink 3 hours before surgery.     __x__ 2. No Alcohol for 24 hours before or after surgery.   __x__3. No Smoking or e-cigarettes for 24 prior to surgery.  Do not use any chewable tobacco products for at least 6 hour prior to surgery   ____  4. Bring all medications with you on the day of surgery if instructed.    __x__ 5. Notify your doctor if there is any change in your medical condition     (cold, fever, infections).    x___6. On the morning of surgery brush your teeth with toothpaste and water.  You may rinse your mouth with mouth wash if you wish.  Do not swallow any toothpaste or mouthwash.   Do not wear jewelry, make-up, hairpins, clips or nail polish.  Do not wear lotions, powders, or perfumes. You may wear deodorant.  Do not shave 48 hours prior to surgery. Men may shave face and neck.  Do  not bring valuables to the hospital.    [facility] is not responsible for any belongings or valuables.               Contacts, dentures or bridgework may not be worn into surgery.  Leave your suitcase in the car. After surgery it may be brought to your room.  For patients admitted to the hospital, discharge time is determined by your  treatment team.  _  Patients discharged the day of surgery will not be allowed to drive home.  You will need someone to drive you home and stay with you the night of your procedure.    Please read over the following fact sheets that you were given:   [facility] Preparing for Surgery   _x___ TAKE THE FOLLOWING MEDICATION THE MORNING OF SURGERY WITH A SMALL SIP OF WATER. These include:  1. WELLBUTRIN (BUPROPION)  2. PRILOSEC (OMEPRAZOLE)  3.  4.  5.  6.  ____Fleets enema or Magnesium Citrate as directed.   ____ Use CHG Soap or sage wipes as directed on instruction sheet   ____ Use inhalers on the day of surgery and bring to hospital day of surgery  ____ Stop Metformin and Janumet 2 days prior to surgery.    ____ Take 1/2 of usual insulin dose the night before surgery and none on the morning surgery.   _x___ Follow  recommendations from Cardiologist, Pulmonologist or PCP regarding stopping Aspirin, Coumadin, Plavix ,Eliquis, Effient, or Pradaxa, and Pletal-STOPPED ASPIRIN LAST WEEK  X____Stop Anti-inflammatories such as Advil, Aleve, Ibuprofen, Motrin, Naproxen, Naprosyn, Goodies powders or aspirin products NOW-OK to take Tylenol    _x___ Stop supplements until after surgery-STOP OMEGA 3 NOW-MAY RESUME AFTER SURGERY   ____ Bring C-Pap to the hospital.

## 2019-07-06 ENCOUNTER — Other Ambulatory Visit
Admission: RE | Admit: 2019-07-06 | Discharge: 2019-07-06 | Disposition: A | Discharge status: Home / Self Care | Payer: Medicare HMO | Source: Ambulatory Visit | Attending: Urology | Admitting: Urology

## 2019-07-06 DIAGNOSIS — N2889 Other specified disorders of kidney and ureter: Secondary | ICD-10-CM | POA: Diagnosis not present

## 2019-07-06 DIAGNOSIS — Z20822 Contact with and (suspected) exposure to covid-19: Secondary | ICD-10-CM | POA: Diagnosis not present

## 2019-07-06 DIAGNOSIS — R310 Gross hematuria: Secondary | ICD-10-CM | POA: Diagnosis present

## 2019-07-06 DIAGNOSIS — N323 Diverticulum of bladder: Secondary | ICD-10-CM | POA: Diagnosis not present

## 2019-07-06 DIAGNOSIS — F17210 Nicotine dependence, cigarettes, uncomplicated: Secondary | ICD-10-CM | POA: Diagnosis not present

## 2019-07-06 LAB — SARS CORONAVIRUS 2 (TAT 6-24 HRS): SARS Coronavirus 2: NEGATIVE

## 2019-07-08 ENCOUNTER — Encounter: Payer: Self-pay | Admitting: Urology

## 2019-07-08 ENCOUNTER — Ambulatory Visit: Payer: Medicare HMO | Admitting: Certified Registered Nurse Anesthetist

## 2019-07-08 ENCOUNTER — Encounter
Admission: RE | Disposition: A | Discharge status: Home / Self Care | Payer: Self-pay | Source: Home / Self Care | Attending: Urology

## 2019-07-08 ENCOUNTER — Ambulatory Visit: Payer: Medicare HMO

## 2019-07-08 ENCOUNTER — Other Ambulatory Visit: Payer: Self-pay

## 2019-07-08 ENCOUNTER — Ambulatory Visit
Admission: RE | Admit: 2019-07-08 | Discharge: 2019-07-08 | Disposition: A | Discharge status: Home / Self Care | Payer: Medicare HMO | Attending: Urology | Admitting: Urology

## 2019-07-08 DIAGNOSIS — Z20822 Contact with and (suspected) exposure to covid-19: Secondary | ICD-10-CM | POA: Insufficient documentation

## 2019-07-08 DIAGNOSIS — R310 Gross hematuria: Secondary | ICD-10-CM

## 2019-07-08 DIAGNOSIS — F17210 Nicotine dependence, cigarettes, uncomplicated: Secondary | ICD-10-CM | POA: Diagnosis not present

## 2019-07-08 DIAGNOSIS — D49512 Neoplasm of unspecified behavior of left kidney: Secondary | ICD-10-CM

## 2019-07-08 DIAGNOSIS — N323 Diverticulum of bladder: Secondary | ICD-10-CM | POA: Insufficient documentation

## 2019-07-08 DIAGNOSIS — N2889 Other specified disorders of kidney and ureter: Secondary | ICD-10-CM | POA: Insufficient documentation

## 2019-07-08 HISTORY — PX: CYSTOSCOPY WITH BIOPSY: SHX5122

## 2019-07-08 HISTORY — PX: URETEROSCOPY: SHX842

## 2019-07-08 SURGERY — CYSTOSCOPY, WITH BIOPSY
Anesthesia: General

## 2019-07-08 MED ORDER — ROCURONIUM BROMIDE 100 MG/10ML IV SOLN
INTRAVENOUS | Status: DC | PRN
  Administered 2019-07-08: 45 mg via INTRAVENOUS
  Administered 2019-07-08: 10 mg via INTRAVENOUS

## 2019-07-08 MED ORDER — FAMOTIDINE 20 MG PO TABS
20.0000 mg | ORAL_TABLET | Freq: Once | ORAL | Status: AC
  Administered 2019-07-08: 07:00:00 20 mg via ORAL

## 2019-07-08 MED ORDER — ACETAMINOPHEN 10 MG/ML IV SOLN
INTRAVENOUS | Status: DC | PRN
  Administered 2019-07-08: 1000 mg via INTRAVENOUS

## 2019-07-08 MED ORDER — OXYBUTYNIN CHLORIDE ER 10 MG PO TB24: 10.0000 mg | ORAL_TABLET | Freq: Every day | ORAL | 0 refills | Status: DC | PRN

## 2019-07-08 MED ORDER — ONDANSETRON HCL 4 MG/2ML IJ SOLN: 4.0000 mg | Freq: Once | INTRAMUSCULAR | Status: DC | PRN

## 2019-07-08 MED ORDER — LIDOCAINE HCL (PF) 2 % IJ SOLN
INTRAMUSCULAR | Status: AC
  Filled 2019-07-08: qty 10

## 2019-07-08 MED ORDER — CEFAZOLIN SODIUM-DEXTROSE 2-4 GM/100ML-% IV SOLN
INTRAVENOUS | Status: AC
  Filled 2019-07-08: qty 100

## 2019-07-08 MED ORDER — LIDOCAINE HCL (CARDIAC) PF 100 MG/5ML IV SOSY
PREFILLED_SYRINGE | INTRAVENOUS | Status: DC | PRN
  Administered 2019-07-08: 100 mg via INTRAVENOUS

## 2019-07-08 MED ORDER — ACETAMINOPHEN 10 MG/ML IV SOLN
INTRAVENOUS | Status: AC
  Filled 2019-07-08: qty 100

## 2019-07-08 MED ORDER — FENTANYL CITRATE (PF) 100 MCG/2ML IJ SOLN
INTRAMUSCULAR | Status: DC | PRN
  Administered 2019-07-08: 50 ug via INTRAVENOUS

## 2019-07-08 MED ORDER — EPHEDRINE SULFATE 50 MG/ML IJ SOLN
INTRAMUSCULAR | Status: DC | PRN
  Administered 2019-07-08: 5 mg via INTRAVENOUS

## 2019-07-08 MED ORDER — TAMSULOSIN HCL 0.4 MG PO CAPS: 0.4000 mg | ORAL_CAPSULE | Freq: Every day | ORAL | 0 refills | Status: DC

## 2019-07-08 MED ORDER — GLYCOPYRROLATE 0.2 MG/ML IJ SOLN
INTRAMUSCULAR | Status: AC
  Filled 2019-07-08: qty 1

## 2019-07-08 MED ORDER — SUGAMMADEX SODIUM 200 MG/2ML IV SOLN
INTRAVENOUS | Status: DC | PRN
  Administered 2019-07-08: 144.2 mg via INTRAVENOUS

## 2019-07-08 MED ORDER — BELLADONNA ALKALOIDS-OPIUM 16.2-60 MG RE SUPP
RECTAL | Status: AC
  Filled 2019-07-08: qty 1

## 2019-07-08 MED ORDER — DEXAMETHASONE SODIUM PHOSPHATE 10 MG/ML IJ SOLN
INTRAMUSCULAR | Status: AC
  Filled 2019-07-08: qty 1

## 2019-07-08 MED ORDER — KETOROLAC TROMETHAMINE 30 MG/ML IJ SOLN
INTRAMUSCULAR | Status: AC
  Filled 2019-07-08: qty 1

## 2019-07-08 MED ORDER — PROPOFOL 10 MG/ML IV BOLUS
INTRAVENOUS | Status: AC
  Filled 2019-07-08: qty 40

## 2019-07-08 MED ORDER — CEFAZOLIN SODIUM-DEXTROSE 2-4 GM/100ML-% IV SOLN
2.0000 g | INTRAVENOUS | Status: AC
  Administered 2019-07-08: 2 g via INTRAVENOUS

## 2019-07-08 MED ORDER — FENTANYL CITRATE (PF) 100 MCG/2ML IJ SOLN: 25.0000 ug | INTRAMUSCULAR | Status: DC | PRN

## 2019-07-08 MED ORDER — LACTATED RINGERS IV SOLN
INTRAVENOUS | Status: DC
  Administered 2019-07-08: 07:00:00 50 mL/h via INTRAVENOUS

## 2019-07-08 MED ORDER — DEXAMETHASONE SODIUM PHOSPHATE 10 MG/ML IJ SOLN
INTRAMUSCULAR | Status: DC | PRN
  Administered 2019-07-08: 10 mg via INTRAVENOUS

## 2019-07-08 MED ORDER — ROCURONIUM BROMIDE 10 MG/ML (PF) SYRINGE
PREFILLED_SYRINGE | INTRAVENOUS | Status: AC
  Filled 2019-07-08: qty 10

## 2019-07-08 MED ORDER — ONDANSETRON HCL 4 MG/2ML IJ SOLN
INTRAMUSCULAR | Status: AC
  Filled 2019-07-08: qty 2

## 2019-07-08 MED ORDER — PROPOFOL 10 MG/ML IV BOLUS
INTRAVENOUS | Status: DC | PRN
  Administered 2019-07-08: 140 mg via INTRAVENOUS

## 2019-07-08 MED ORDER — ONDANSETRON HCL 4 MG/2ML IJ SOLN
INTRAMUSCULAR | Status: DC | PRN
  Administered 2019-07-08: 4 mg via INTRAVENOUS

## 2019-07-08 MED ORDER — MIDAZOLAM HCL 2 MG/2ML IJ SOLN
INTRAMUSCULAR | Status: AC
  Filled 2019-07-08: qty 2

## 2019-07-08 MED ORDER — KETOROLAC TROMETHAMINE 30 MG/ML IJ SOLN
INTRAMUSCULAR | Status: DC | PRN
  Administered 2019-07-08: 15 mg via INTRAVENOUS

## 2019-07-08 MED ORDER — FAMOTIDINE 20 MG PO TABS
ORAL_TABLET | ORAL | Status: AC
  Filled 2019-07-08: qty 1

## 2019-07-08 MED ORDER — FENTANYL CITRATE (PF) 100 MCG/2ML IJ SOLN
INTRAMUSCULAR | Status: AC
  Filled 2019-07-08: qty 2

## 2019-07-08 MED ORDER — EPHEDRINE 5 MG/ML INJ
INTRAVENOUS | Status: AC
  Filled 2019-07-08: qty 10

## 2019-07-08 MED ORDER — MIDAZOLAM HCL 2 MG/2ML IJ SOLN
INTRAMUSCULAR | Status: DC | PRN
  Administered 2019-07-08: 2 mg via INTRAVENOUS

## 2019-07-08 SURGICAL SUPPLY — 41 items
BAG DRAIN CYSTO-URO LG1000N (MISCELLANEOUS) ×3 IMPLANT
BRUSH SCRUB EZ  4% CHG (MISCELLANEOUS) ×3
BRUSH SCRUB EZ 1% IODOPHOR (MISCELLANEOUS) ×3 IMPLANT
BRUSH SCRUB EZ 4% CHG (MISCELLANEOUS) ×2 IMPLANT
BULB IRRIG PATHFIND (MISCELLANEOUS) IMPLANT
CATH URETL 5X70 OPEN END (CATHETERS) ×3 IMPLANT
CNTNR SPEC 2.5X3XGRAD LEK (MISCELLANEOUS) ×2
CONRAY 43 FOR UROLOGY 50M (MISCELLANEOUS) ×6 IMPLANT
CONT SPEC 4OZ STER OR WHT (MISCELLANEOUS) ×1
CONT SPEC 4OZ STRL OR WHT (MISCELLANEOUS) ×2
CONTAINER SPEC 2.5X3XGRAD LEK (MISCELLANEOUS) ×2 IMPLANT
DILATOR UROMAX ULTRA (MISCELLANEOUS) ×1 IMPLANT
DRSG TELFA 4X3 1S NADH ST (GAUZE/BANDAGES/DRESSINGS) ×3 IMPLANT
ELECT REM PT RETURN 9FT ADLT (ELECTROSURGICAL) ×3
ELECTRODE REM PT RTRN 9FT ADLT (ELECTROSURGICAL) ×2 IMPLANT
FORCEPS BIOP PIRANHA Y (CUTTING FORCEPS) ×1 IMPLANT
GLOVE BIOGEL PI IND STRL 7.5 (GLOVE) ×2 IMPLANT
GLOVE BIOGEL PI INDICATOR 7.5 (GLOVE) ×1
GOWN STRL REUS W/ TWL LRG LVL3 (GOWN DISPOSABLE) ×2 IMPLANT
GOWN STRL REUS W/ TWL XL LVL3 (GOWN DISPOSABLE) ×2 IMPLANT
GOWN STRL REUS W/TWL LRG LVL3 (GOWN DISPOSABLE) ×6
GOWN STRL REUS W/TWL XL LVL3 (GOWN DISPOSABLE) ×3
GUIDEWIRE GREEN .038 145CM (MISCELLANEOUS) ×4 IMPLANT
GUIDEWIRE STR DUAL SENSOR (WIRE) ×3 IMPLANT
INFUSOR MANOMETER BAG 3000ML (MISCELLANEOUS) ×3 IMPLANT
INTRODUCER DILATOR DOUBLE (INTRODUCER) ×1 IMPLANT
KIT TURNOVER CYSTO (KITS) ×3 IMPLANT
PACK CYSTO AR (MISCELLANEOUS) ×3 IMPLANT
SET CYSTO W/LG BORE CLAMP LF (SET/KITS/TRAYS/PACK) ×3 IMPLANT
SET DILATOR URETRAL 8.5FR (MISCELLANEOUS) ×1 IMPLANT
SHEATH URETERAL 12FRX35CM (MISCELLANEOUS) ×1 IMPLANT
SOL .9 NS 3000ML IRR  AL (IV SOLUTION) ×3
SOL .9 NS 3000ML IRR AL (IV SOLUTION) ×2
SOL .9 NS 3000ML IRR UROMATIC (IV SOLUTION) ×2 IMPLANT
STENT URET 6FRX24 CONTOUR (STENTS) ×2 IMPLANT
SURGILUBE 2OZ TUBE FLIPTOP (MISCELLANEOUS) ×3 IMPLANT
TUBING ART PRESS 48 MALE/FEM (TUBING) IMPLANT
VALVE UROSEAL ADJ ENDO (VALVE) IMPLANT
WATER STERILE IRR 1000ML POUR (IV SOLUTION) ×3 IMPLANT
WATER STERILE IRR 3000ML UROMA (IV SOLUTION) ×3 IMPLANT
disposable vaginal speculum ×1 IMPLANT

## 2019-07-08 NOTE — Anesthesia Preprocedure Evaluation (Signed)
Anesthesia Evaluation  Patient identified by MRN, date of birth, ID band Patient awake    Reviewed: Allergy & Precautions, NPO status , Patient's Chart, lab work & pertinent test results  History of Anesthesia Complications Negative for: history of anesthetic complications  Airway Mallampati: II       Dental   Pulmonary neg sleep apnea, neg COPD, Current Smoker,           Cardiovascular (-) hypertension(-) Past MI and (-) CHF (-) pacemaker(-) Valvular Problems/Murmurs     Neuro/Psych neg Seizures Depression    GI/Hepatic Neg liver ROS, GERD  Medicated and Poorly Controlled,  Endo/Other  neg diabetes  Renal/GU negative Renal ROS     Musculoskeletal   Abdominal   Peds  Hematology   Anesthesia Other Findings   Reproductive/Obstetrics                            Anesthesia Physical Anesthesia Plan  ASA: II  Anesthesia Plan: General   Post-op Pain Management:    Induction: Intravenous  PONV Risk Score and Plan: 2 and Ondansetron and Dexamethasone  Airway Management Planned: Oral ETT  Additional Equipment:   Intra-op Plan:   Post-operative Plan:   Informed Consent: I have reviewed the patients History and Physical, chart, labs and discussed the procedure including the risks, benefits and alternatives for the proposed anesthesia with the patient or authorized representative who has indicated his/her understanding and acceptance.       Plan Discussed with:   Anesthesia Plan Comments:         Anesthesia Quick Evaluation

## 2019-07-08 NOTE — Discharge Instructions (Signed)

## 2019-07-08 NOTE — Op Note (Signed)
Date of procedure: 07/08/19  Preoperative diagnosis:  1. Gross hematuria  Postoperative diagnosis:  1. Same  Procedure: 1. Exam under anesthesia 2. Bilateral retrograde pyelograms with intraoperative interpretation 3. Cystoscopy, bladder biopsy, and fulguration 4. Left diagnostic ureteroscopy, left renal biopsy, left ureteral stent placement 5. Right diagnostic ureteroscopy, right ureteral stent placement  Surgeon: Nickolas Madrid, MD  Anesthesia: General  Complications: None  Intraoperative findings:  1.  On pelvic exam, cystocele and rectocele, no other abnormalities or masses 2.  Cystoscopy with small diverticulum posteriorly with slightly irregular mucosa within, biopsied and fulgurated 3.  Bloody efflux from left ureteral orifice, clear yellow efflux from right ureteral orifice 4.  Bilateral ureteroscopy with no tumors or lesions, suspect papillary necrosis as etiology of hematuria 5.  Uncomplicated bilateral ureteral stent placement   EBL: Minimal  Specimens:  1.  Bladder biopsy 2.  Left renal papillae biopsy  Drains: Bilateral 6 French by 24 cm ureteral stents  Indication: Shelley White is a 64 y.o. patient with gross hematuria for over a month as well as a CT urogram showing a possible right upper pole filling defect.  After reviewing the management options for treatment, they elected to proceed with the above surgical procedure(s). We have discussed the potential benefits and risks of the procedure, side effects of the proposed treatment, the likelihood of the patient achieving the goals of the procedure, and any potential problems that might occur during the procedure or recuperation. Informed consent has been obtained.  Description of procedure:  The patient was taken to the operating room and general anesthesia was induced. SCDs were placed for DVT prophylaxis. The patient was placed in the dorsal lithotomy position, prepped and draped in the usual sterile  fashion, and preoperative antibiotics(Ancef) were administered. A preoperative time-out was performed.   Pelvic and bimanual exam showed a rectocele and cystocele with no other abnormalities or lesions.  There were no masses.  A 21 French rigid cystoscope was used to intubate the urethra and thorough cystoscopy was performed.  There was a small 1 cm diverticulum at the posterior bladder wall with some slightly irregular mucosa within.  The ureteral orifices were orthotopic bilaterally, and the bladder mucosa was otherwise grossly normal.  There was bloody efflux from the left ureteral orifice, and clear yellow efflux from the right ureteral orifice.  A flexible cold cup biopsy was passed through the rigid cystoscope and a biopsy was taken of the slightly irregular mucosa within the posterior diverticulum.  The Bugbee was then used to fulgurate this area.  There was excellent hemostasis.  With the aid of a 5 [place] access catheter, a left retrograde pyelogram was performed which showed no filling defects in the ureter or within the renal pelvis.  A sensor wire was advanced up into the kidney under fluoroscopic vision and passed easily.  A semirigid ureteroscope was then advanced alongside the wire and the distal ureter was tight, however there were no mucosal abnormalities in the distal or mid left ureter.  I attempted to pass a single channel digital flexible ureteroscope however it met resistance at the ureteral orifice.  I attempted to perform dilation with a dual-lumen access catheter, but this also met resistance.  A second safety wire was added.  Under fluoroscopic vision, a 15 French by 6 cm UroMax dilating balloon was used to dilate the distal ureter.  A 12/14 French by 35 ureteral access sheath was gently advanced over the wire on the left side up into the proximal  ureter and passed easily.  The single channel digital flexible ureteroscope was advanced through the access sheath and thorough  pyeloscopy was performed.  There were no abnormalities in the proximal ureter and thorough inspection of the kidney revealed no tumors or papillary lesions.  There was some subtle erythema at all of the renal papilla within the left kidney that was suspicious for papillary necrosis.  A Piranha biopsy forcep was used to take a biopsy of one of the papilla and sent for pathology.  There was no bleeding noted.  Careful pullback ureteroscopy demonstrated no other suspicious lesions and no injury.  A 6 French by 24 cm stent was uneventfully placed over the wire under fluoroscopic vision with a shepherd's hook in the lower pole, and a curl in the bladder.  I then turned my attention to the right side and a retrograde pyelogram was performed showing again no filling defects in the ureter or in the renal pelvis.  A sensor wire was advanced up into the kidney under fluoroscopic vision and passed easily.  A semirigid ureteroscope was passed along the wire and showed a tight right distal ureter but no mucosal lesions.  I attempted to pass the single channel digital flexible ureteroscope but met resistance.  The UroMax dilating balloon was again used to dilate the right distal ureter identical to the left side.  The single channel digital flexible ureteroscope was then advanced over the wire and thorough pyeloscopy was performed.  This showed similar findings to the left side with some subtle erythema at all of the renal papilla, but no frank tumors or lesions.  The wire was replaced through the scope, and a 6 [place] by 24 cm stent was again uneventfully placed over the wire on the right side with an excellent curl in the renal pelvis, as well as in the bladder.  The bladder was drained and this concluded our procedure.  There was excellent hemostasis under low flow conditions.  Disposition: Stable to PACU  Plan: Follow-up pathology If pathology benign, consider referral to nephrology for possible renal papillary  necrosis, consider sickle cell work-up Stent removal in 5 to 10 days  Nickolas Madrid, MD

## 2019-07-08 NOTE — Anesthesia Postprocedure Evaluation (Signed)
Anesthesia Post Note  Patient: Shelley White  Procedure(s) Performed: CYSTOSCOPY WITH  BIOPSY (N/A ) DIAGNOSTIC URETEROSCOPY (Bilateral )  Patient location during evaluation: PACU Anesthesia Type: General Level of consciousness: awake and alert Pain management: pain level controlled Vital Signs Assessment: post-procedure vital signs reviewed and stable Respiratory status: spontaneous breathing and respiratory function stable Cardiovascular status: stable Anesthetic complications: no     Last Vitals:  Vitals:   07/08/19 0855 07/08/19 0910  BP: 127/75 107/70  Pulse: 86 78  Resp: 18 16  Temp: (!) 36.4 C   SpO2: 100% 100%    Last Pain:  Vitals:   07/08/19 0855  TempSrc:   PainSc: Asleep                 Khamryn Calderone K

## 2019-07-08 NOTE — Transfer of Care (Signed)
Immediate Anesthesia Transfer of Care Note  Patient: IXEL GIGER  Procedure(s) Performed: CYSTOSCOPY WITH  BIOPSY (N/A ) DIAGNOSTIC URETEROSCOPY (Bilateral )  Patient Location: PACU  Anesthesia Type:General  Level of Consciousness: drowsy  Airway & Oxygen Therapy: Patient Spontanous Breathing and Patient connected to face mask oxygen  Post-op Assessment: Report given to RN and Post -op Vital signs reviewed and stable  Post vital signs: Reviewed and stable  Last Vitals:  Vitals Value Taken Time  BP    Temp    Pulse 86 07/08/19 0858  Resp 21 07/08/19 0858  SpO2 100 % 07/08/19 0858  Vitals shown include unvalidated device data.  Last Pain:  Vitals:   07/08/19 0626  TempSrc: Tympanic  PainSc: 0-No pain         Complications: No apparent anesthesia complications

## 2019-07-08 NOTE — H&P (Signed)
UROLOGY H&P UPDATE  Agree with prior H&P dated 3/16.  She is a 64 year old female with recurrent gross hematuria over the last month and CT urogram showing a possible upper pole right-sided filling defect.  Cardiac: RRR Lungs: CTA bilaterally  Laterality: Right Procedure: Cystoscopy, possible biopsy/fulguration/TURBT, bilateral retrograde pyelograms, right diagnostic ureteroscopy, possible biopsy, possible laser ablation  Urine: Urinalysis 3/16 0-5 WBCs, greater than 50 RBCs, no bacteria, nitrite negative, no leukocytes  Informed consent obtained, we specifically discussed the risks of bleeding, infection, post-operative pain, need for additional procedures, possible need for additional procedures pending pathology results, stent related symptoms including urgency, frequency, dysuria, hematuria, flank pain, ureteral injury or perforation.  Billey Co, MD 07/08/2019

## 2019-07-08 NOTE — Anesthesia Procedure Notes (Signed)
Procedure Name: Intubation Performed by: Demetrius Charity, CRNA Pre-anesthesia Checklist: Patient identified, Patient being monitored, Timeout performed, Emergency Drugs available and Suction available Patient Re-evaluated:Patient Re-evaluated prior to induction Oxygen Delivery Method: Circle system utilized Preoxygenation: Pre-oxygenation with 100% oxygen Induction Type: IV induction Ventilation: Mask ventilation without difficulty Laryngoscope Size: Mac and 3 Grade View: Grade II Tube type: Oral Tube size: 7.0 mm Number of attempts: 1 Airway Equipment and Method: Stylet Placement Confirmation: ETT inserted through vocal cords under direct vision,  positive ETCO2 and breath sounds checked- equal and bilateral Secured at: 22 cm Tube secured with: Tape Dental Injury: Teeth and Oropharynx as per pre-operative assessment

## 2019-07-08 NOTE — OR Nursing (Signed)
Per Dr. Diamantina Providence, secure chat, patient may resume aspirin on Monday 07/11/19, added to discharge instructions - medication section.

## 2019-07-11 LAB — SURGICAL PATHOLOGY

## 2019-07-12 ENCOUNTER — Other Ambulatory Visit: Payer: Self-pay | Admitting: Family Medicine

## 2019-07-12 MED ORDER — OXYBUTYNIN CHLORIDE ER 10 MG PO TB24: 10.0000 mg | ORAL_TABLET | Freq: Every day | ORAL | 0 refills | Status: AC | PRN

## 2019-07-14 ENCOUNTER — Encounter: Payer: Self-pay | Admitting: Urology

## 2019-07-14 ENCOUNTER — Other Ambulatory Visit: Payer: Self-pay

## 2019-07-14 ENCOUNTER — Ambulatory Visit (INDEPENDENT_AMBULATORY_CARE_PROVIDER_SITE_OTHER): Payer: Medicare HMO | Admitting: Urology

## 2019-07-14 VITALS — BP 121/78 | HR 123 | Ht 66.0 in | Wt 158.0 lb

## 2019-07-14 DIAGNOSIS — N819 Female genital prolapse, unspecified: Secondary | ICD-10-CM | POA: Diagnosis not present

## 2019-07-14 DIAGNOSIS — R310 Gross hematuria: Secondary | ICD-10-CM | POA: Diagnosis not present

## 2019-07-14 LAB — URINALYSIS, COMPLETE
Bilirubin, UA: NEGATIVE
Glucose, UA: NEGATIVE
Ketones, UA: NEGATIVE
Nitrite, UA: NEGATIVE
Specific Gravity, UA: 1.015 (ref 1.005–1.030)
Urobilinogen, Ur: 0.2 mg/dL (ref 0.2–1.0)
pH, UA: 6 (ref 5.0–7.5)

## 2019-07-14 LAB — MICROSCOPIC EXAMINATION
Bacteria, UA: NONE SEEN
RBC, Urine: >30 /hpf — AB (ref 0–2)

## 2019-07-14 NOTE — Progress Notes (Signed)
Cystoscopy Procedure Note:  Indication: Stent removal s/p b/l diagnostic URS, left renal biopsy, b/l ureteral stent placement  After informed consent and discussion of the procedure and its risks, Shelley White was positioned and prepped in the standard fashion. Cystoscopy was performed with a flexible cystoscope. The left stent was grasped with flexible graspers and removed in its entirety. The scope was re-inserted and the right stent was grasped and removed. The patient tolerated the procedure well.  Findings: Uncomplicated bilateral stent removal  ------------------------------------------------------------------------------------------------------------  Assessment and Plan: We discussed possible etiologies of her gross hematuria at length.  We reviewed the pathology results from the bladder and the left kidney that showed only benign tissue and no evidence of malignancy.  She does have sickle cell trait.  We discussed at length that I do not have an exact cause for her episodes of gross hematuria, however it is very reassuring that her cystoscopy and bilateral ureteroscopy showed no evidence of tumors or malignancy, and biopsies were negative.  She also had questions about prolapse today, and I recommended making an appointment with her gynecologist to discuss pessary versus prolapse surgery.  Follow-up in 6 weeks with repeat urinalysis, if persistent hematuria consider cytology  I spent 20 total minutes on the day of the encounter including pre-visit review of the medical record, face-to-face time with the patient, and post visit ordering of labs/imaging/tests.   Billey Co, MD 07/14/2019

## 2019-07-14 NOTE — Patient Instructions (Signed)
Pelvic Organ Prolapse Pelvic organ prolapse is the stretching, bulging, or dropping of pelvic organs into an abnormal position. It happens when the muscles and tissues that surround and support pelvic structures become weak or stretched. Pelvic organ prolapse can involve the:  Vagina (vaginal prolapse).  Uterus (uterine prolapse).  Bladder (cystocele).  Rectum (rectocele).  Intestines (enterocele). When organs other than the vagina are involved, they often bulge into the vagina or protrude from the vagina, depending on how severe the prolapse is. What are the causes? This condition may be caused by:  Pregnancy, labor, and childbirth.  Past pelvic surgery.  Decreased production of the hormone estrogen associated with menopause.  Consistently lifting more than 50 lb (23 kg).  Obesity.  Long-term inability to pass stool (chronic constipation).  A cough that lasts a long time (chronic).  Buildup of fluid in the abdomen due to certain diseases and other conditions. What are the signs or symptoms? Symptoms of this condition include:  Passing a little urine (loss of bladder control) when you cough, sneeze, strain, and exercise (stress incontinence). This may be worse immediately after childbirth. It may gradually improve over time.  Feeling pressure in your pelvis or vagina. This pressure may increase when you cough or when you are passing stool.  A bulge that protrudes from the opening of your vagina.  Difficulty passing urine or stool.  Pain in your lower back.  Pain, discomfort, or disinterest in sex.  Repeated bladder infections (urinary tract infections).  Difficulty inserting a tampon. In some people, this condition causes no symptoms. How is this diagnosed? This condition may be diagnosed based on a vaginal and rectal exam. During the exam, you may be asked to cough and strain while you are lying down, sitting, and standing up. Your health care provider will  determine if other tests are required, such as bladder function tests. How is this treated? Treatment for this condition may depend on your symptoms. Treatment may include:  Lifestyle changes, such as changes to your diet.  Emptying your bladder at scheduled times (bladder training therapy). This can help reduce or avoid urinary incontinence.  Estrogen. Estrogen may help mild prolapse by increasing the strength and tone of pelvic floor muscles.  Kegel exercises. These may help mild cases of prolapse by strengthening and tightening the muscles of the pelvic floor.  A soft, flexible device that helps support the vaginal walls and keep pelvic organs in place (pessary). This is inserted into your vagina by your health care provider.  Surgery. This is often the only form of treatment for severe prolapse. Follow these instructions at home:  Avoid drinking beverages that contain caffeine or alcohol.  Increase your intake of high-fiber foods. This can help decrease constipation and straining during bowel movements.  Lose weight if recommended by your health care provider.  Wear a sanitary pad or adult diapers if you have urinary incontinence.  Avoid heavy lifting and straining with exercise and work. Do not hold your breath when you perform mild to moderate lifting and exercise activities. Limit your activities as directed by your health care provider.  Do Kegel exercises as directed by your health care provider. To do this: ? Squeeze your pelvic floor muscles tight. You should feel a tight lift in your rectal area and a tightness in your vaginal area. Keep your stomach, buttocks, and legs relaxed. ? Hold the muscles tight for up to 10 seconds. ? Relax your muscles. ? Repeat this exercise 50 times a day,   or as many times as told by your health care provider. Continue to do this exercise for at least 4-6 weeks, or for as long as told by your health care provider.  Take over-the-counter and  prescription medicines only as told by your health care provider.  If you have a pessary, take care of it as told by your health care provider.  Keep all follow-up visits as told by your health care provider. This is important. Contact a health care provider if you:  Have symptoms that interfere with your daily activities or sex life.  Need medicine to help with the discomfort.  Notice bleeding from your vagina that is not related to your period.  Have a fever.  Have pain or bleeding when you urinate.  Have bleeding when you pass stool.  Pass urine when you have sex.  Have chronic constipation.  Have a pessary that falls out.  Have bad smelling vaginal discharge.  Have an unusual, low pain in your abdomen. Summary  Pelvic organ prolapse is the stretching, bulging, or dropping of pelvic organs into an abnormal position. It happens when the muscles and tissues that surround and support pelvic structures become weak or stretched.  When organs other than the vagina are involved, they often bulge into the vagina or protrude from the vagina, depending on how severe the prolapse is.  In most cases, this condition needs to be treated only if it produces symptoms. Treatment may include lifestyle changes, estrogen, Kegel exercises, pessary insertion, or surgery.  Avoid heavy lifting and straining with exercise and work. Do not hold your breath when you perform mild to moderate lifting and exercise activities. Limit your activities as directed by your health care provider. This information is not intended to replace advice given to you by your health care provider. Make sure you discuss any questions you have with your health care provider. Document Revised: 04/22/2017 Document Reviewed: 04/22/2017 Elsevier Patient Education  2020 [employer]  

## 2019-07-20 ENCOUNTER — Encounter: Payer: Medicare HMO | Admitting: Urology

## 2019-07-27 DIAGNOSIS — N993 Prolapse of vaginal vault after hysterectomy: Secondary | ICD-10-CM | POA: Diagnosis not present

## 2019-07-27 DIAGNOSIS — N8111 Cystocele, midline: Secondary | ICD-10-CM | POA: Diagnosis not present

## 2019-07-27 DIAGNOSIS — Z87448 Personal history of other diseases of urinary system: Secondary | ICD-10-CM | POA: Diagnosis not present

## 2019-08-25 ENCOUNTER — Ambulatory Visit: Payer: Self-pay | Admitting: Urology

## 2019-09-15 IMAGING — US US RENAL
1 series · 14 of 25 positions shown · non-contrast
Comparison: CT 07/12/2008.

CLINICAL DATA: Hematuria.

EXAM:
RENAL / URINARY TRACT ULTRASOUND COMPLETE

[Series 1: us renal · 0.23mm/px · 41 acquisitions, 14 frames shown]
[im 1/41]
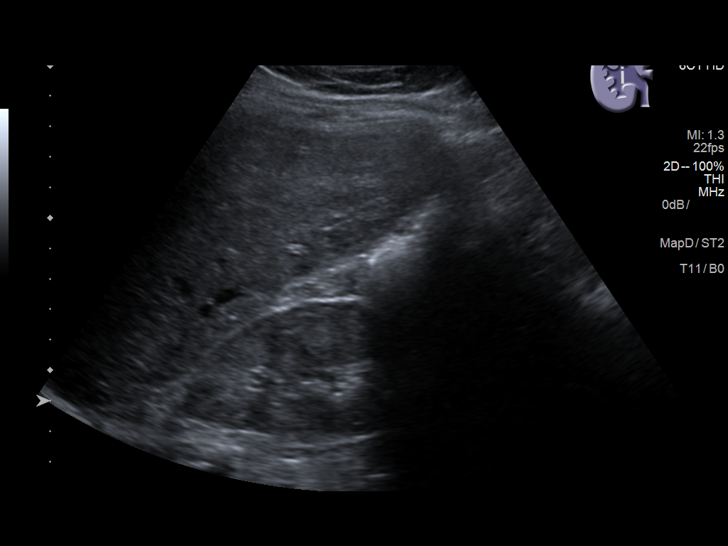
[im 4/41]
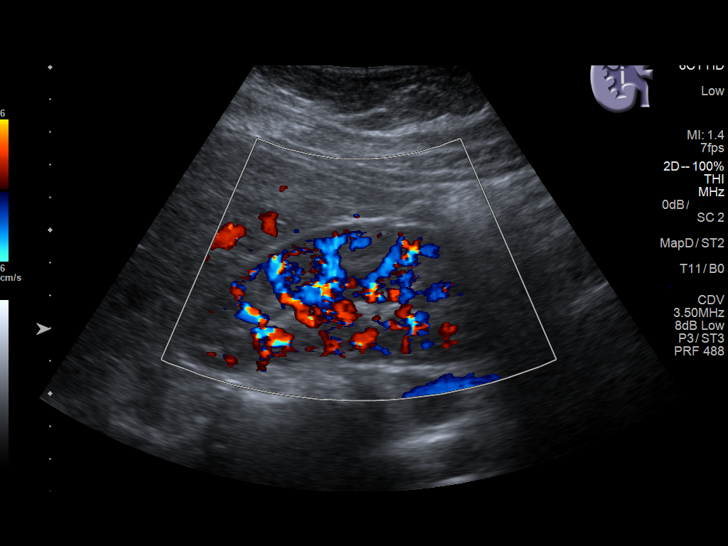
[im 7/41]
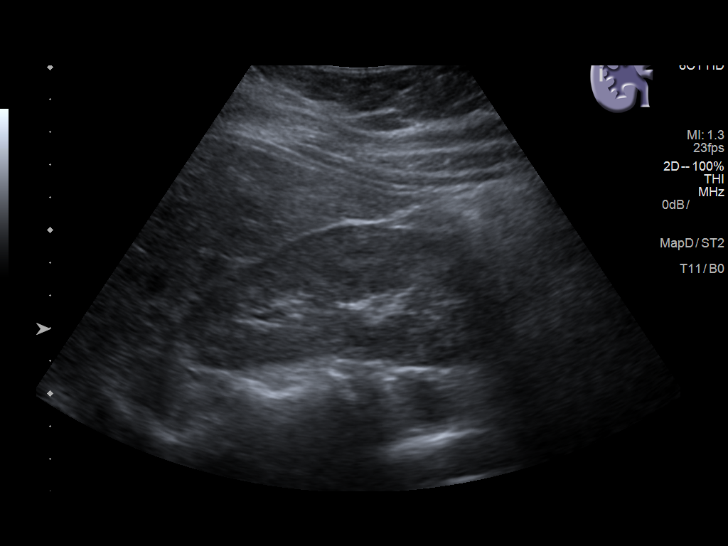
[im 11/41]
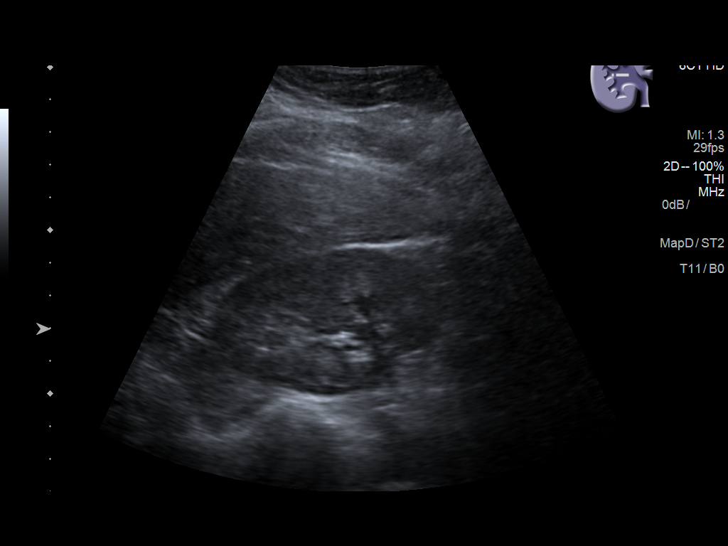
[im 14/41]
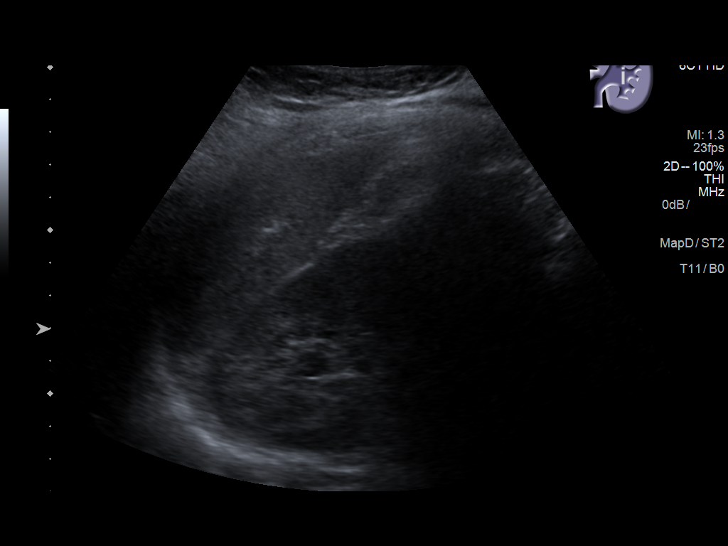
[im 16/41]
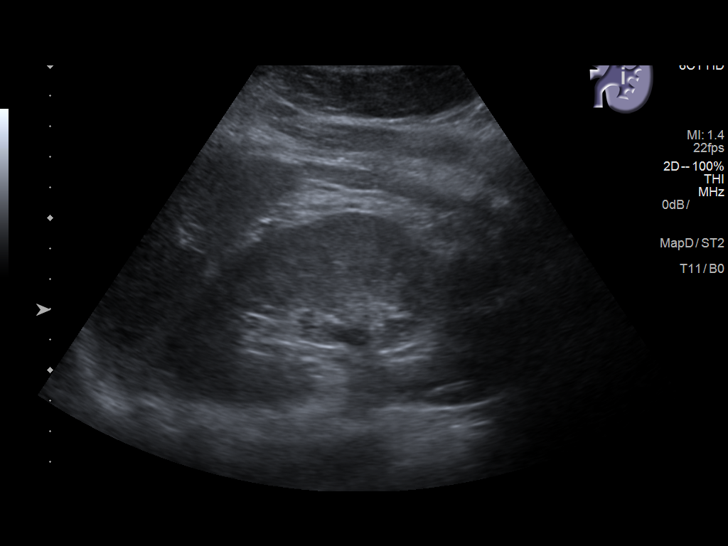
[im 19/41]
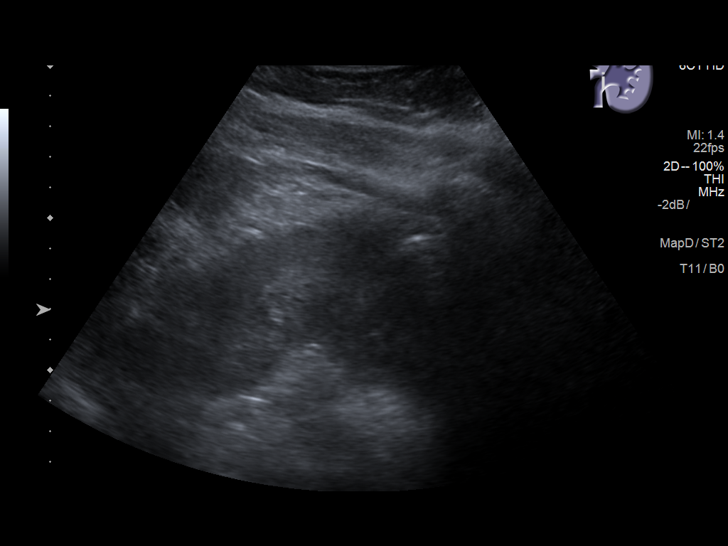
[im 22/41]
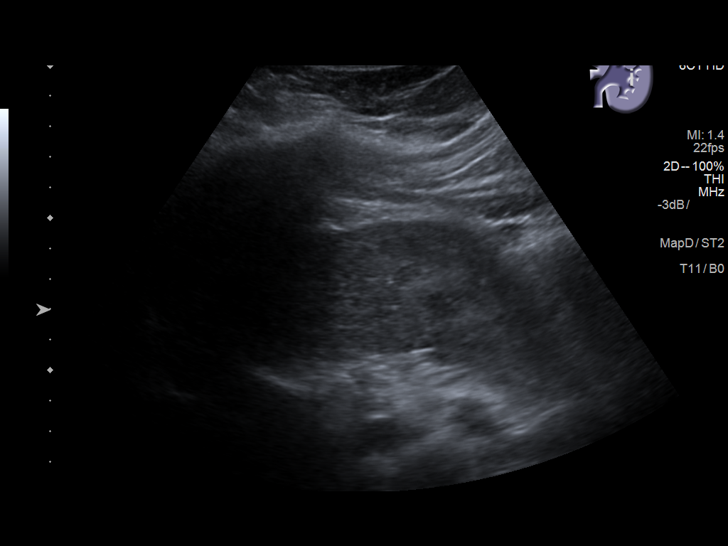
[im 26/41]
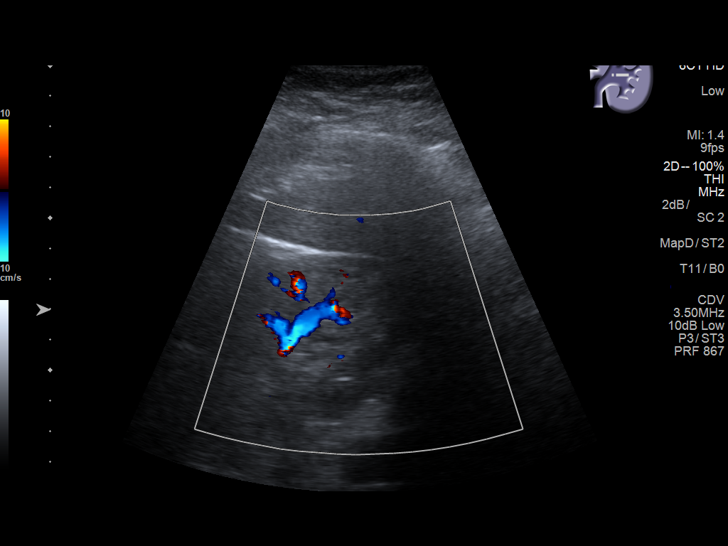
[im 27/41]
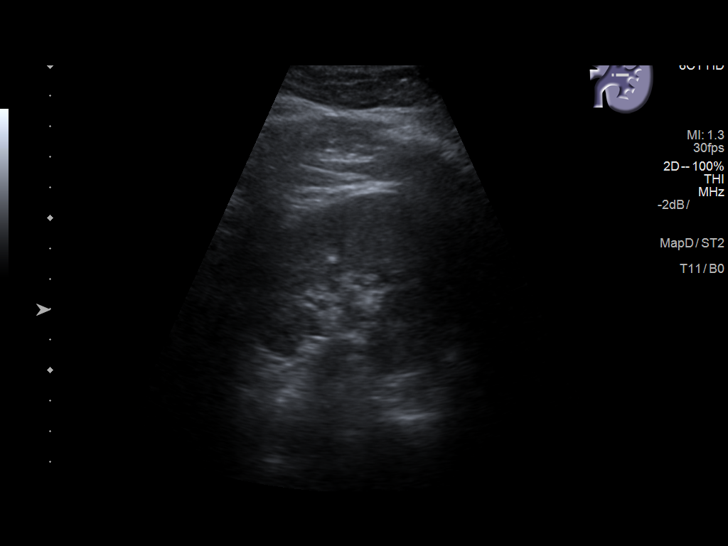
[im 31/41]
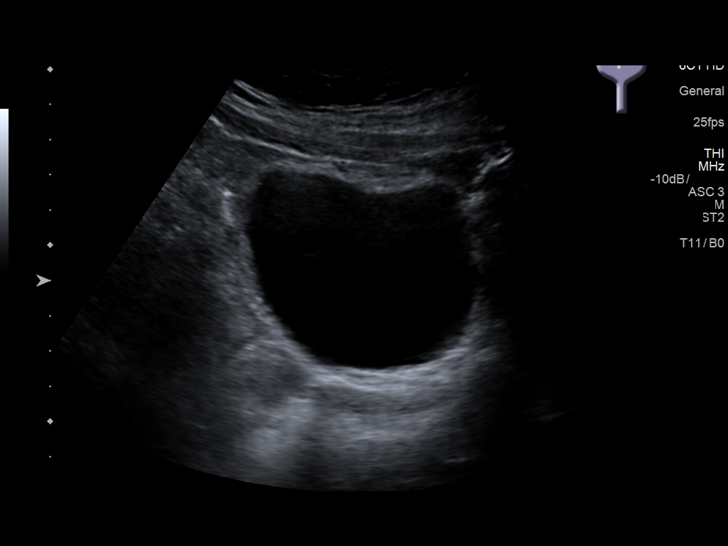
[im 34/41]
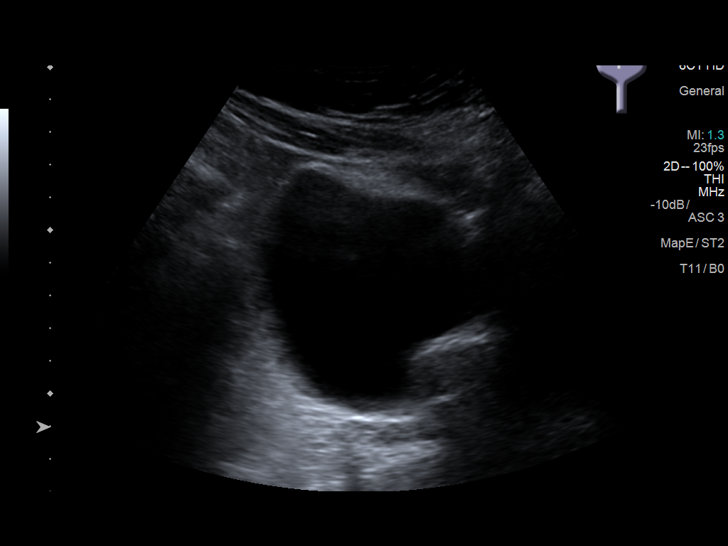
[im 37/41]
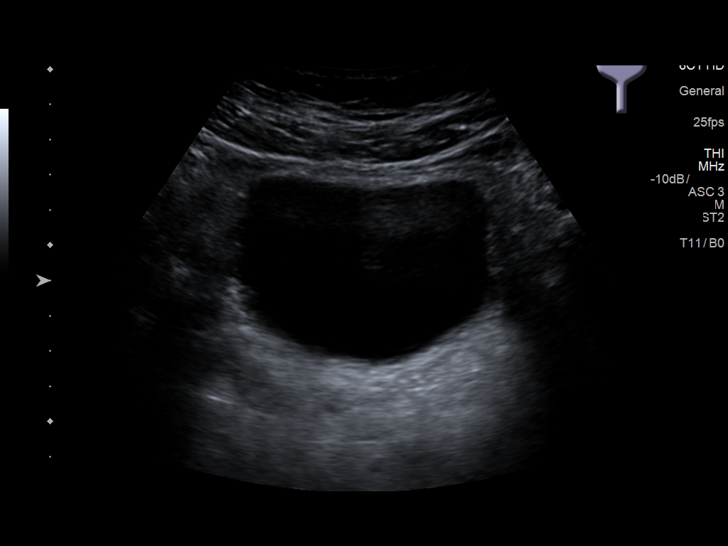
[im 41/41]
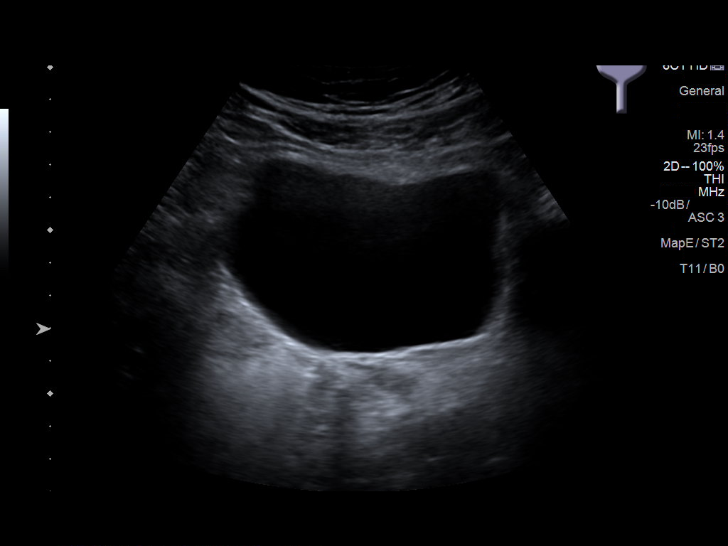

[14 of 25 positions shown; findings below may reference images not displayed]

FINDINGS: Right Kidney:

Length: 10.0 cm. Increased echogenicity. No mass or hydronephrosis
visualized.

Left Kidney:

Length: 11.3 cm. Increased echogenicity. No mass or hydronephrosis
visualized.

Bladder:

Appears normal for degree of bladder distention.
IMPRESSION: Increased echogenicity both kidneys suggesting chronic medical renal
disease. No acute abnormality. No hydronephrosis.

## 2019-10-25 ENCOUNTER — Other Ambulatory Visit: Payer: Self-pay

## 2019-10-25 DIAGNOSIS — Z1211 Encounter for screening for malignant neoplasm of colon: Secondary | ICD-10-CM

## 2019-10-25 MED ORDER — NA SULFATE-K SULFATE-MG SULF 17.5-3.13-1.6 GM/177ML PO SOLN: 1.0000 | Freq: Once | ORAL | 0 refills | Status: AC

## 2019-10-25 NOTE — Progress Notes (Signed)
Gastroenterology Pre-Procedure Review  Request Date: fRIDAY 11/18/19 Requesting Physician: Dr. Bonna Gains  PATIENT REVIEW QUESTIONS: The patient responded to the following health history questions as indicated:    1. Are you having any GI issues? no GI issues, however experienced blood in urine normal urology referral was completec 2. Do you have a personal history of Polyps? no 3. Do you have a family history of Colon Cancer or Polyps? no 4. Diabetes Mellitus? no 5. Joint replacements in the past 12 months?no 6. Major health problems in the past 3 months?no 7. Any artificial heart valves, MVP, or defibrillator?no    MEDICATIONS & ALLERGIES:    Patient reports the following regarding taking any anticoagulation/antiplatelet therapy:   Plavix, Coumadin, Eliquis, Xarelto, Lovenox, Pradaxa, Brilinta, or Effient? no Aspirin? no pt states she does not take Aspirin 325mg    Patient confirms/reports the following medications:  Current Outpatient Medications  Medication Sig Dispense Refill  . acetaminophen (TYLENOL) 500 MG tablet Take 500 mg by mouth every 6 (six) hours as needed.    Marland Kitchen aspirin EC 325 MG tablet Take 325 mg by mouth daily.     Marland Kitchen buPROPion (WELLBUTRIN XL) 150 MG 24 hr tablet Take 150 mg by mouth every morning.     . cetirizine (ZYRTEC) 10 MG tablet Take 10 mg by mouth at bedtime.     . hydrOXYzine (ATARAX/VISTARIL) 25 MG tablet Take 25 mg by mouth at bedtime.     . Omega-3 Fatty Acids (OMEGA 3 500) 500 MG CAPS Take 500 mg by mouth daily.    Marland Kitchen omeprazole (PRILOSEC) 20 MG capsule Take 20 mg by mouth 2 (two) times daily before a meal.     . tamsulosin (FLOMAX) 0.4 MG CAPS capsule Take 1 capsule (0.4 mg total) by mouth daily after supper. 10 capsule 0  . zinc gluconate 50 MG tablet Take 50 mg by mouth daily.     Current Facility-Administered Medications  Medication Dose Route Frequency Provider Last Rate Last Admin  . betamethasone acetate-betamethasone sodium phosphate (CELESTONE)  injection 3 mg  3 mg Intramuscular Once Edrick Kins, DPM        Patient confirms/reports the following allergies:  Allergies  Allergen Reactions  . Codeine Itching and Swelling    No orders of the defined types were placed in this encounter.   AUTHORIZATION INFORMATION Primary Insurance: 1D#: Group #:  Secondary Insurance: 1D#: Group #:  SCHEDULE INFORMATION: Date: 11/18/19 Time: Location:[facility]

## 2019-11-16 ENCOUNTER — Other Ambulatory Visit
Admission: RE | Admit: 2019-11-16 | Discharge: 2019-11-16 | Disposition: A | Discharge status: Home / Self Care | Payer: Medicare HMO | Source: Ambulatory Visit | Attending: Gastroenterology | Admitting: Gastroenterology

## 2019-11-16 ENCOUNTER — Other Ambulatory Visit: Payer: Self-pay

## 2019-11-16 ENCOUNTER — Ambulatory Visit
Admission: RE | Admit: 2019-11-16 | Discharge: 2019-11-16 | Disposition: A | Discharge status: Home / Self Care | Payer: Medicare HMO | Source: Ambulatory Visit | Attending: Family Medicine | Admitting: Family Medicine

## 2019-11-16 ENCOUNTER — Other Ambulatory Visit: Payer: Self-pay | Admitting: Family Medicine

## 2019-11-16 DIAGNOSIS — Z1231 Encounter for screening mammogram for malignant neoplasm of breast: Secondary | ICD-10-CM | POA: Insufficient documentation

## 2019-11-16 DIAGNOSIS — Z01812 Encounter for preprocedural laboratory examination: Secondary | ICD-10-CM | POA: Insufficient documentation

## 2019-11-16 DIAGNOSIS — Z20822 Contact with and (suspected) exposure to covid-19: Secondary | ICD-10-CM | POA: Insufficient documentation

## 2019-11-16 LAB — SARS CORONAVIRUS 2 (TAT 6-24 HRS): SARS Coronavirus 2: NEGATIVE

## 2019-11-18 ENCOUNTER — Ambulatory Visit: Payer: Medicare HMO | Admitting: Certified Registered Nurse Anesthetist

## 2019-11-18 ENCOUNTER — Encounter: Payer: Self-pay | Admitting: Gastroenterology

## 2019-11-18 ENCOUNTER — Telehealth: Payer: Self-pay

## 2019-11-18 ENCOUNTER — Ambulatory Visit
Admission: RE | Admit: 2019-11-18 | Discharge: 2019-11-18 | Disposition: A | Discharge status: Home / Self Care | Payer: Medicare HMO | Attending: Gastroenterology | Admitting: Gastroenterology

## 2019-11-18 ENCOUNTER — Other Ambulatory Visit: Payer: Self-pay

## 2019-11-18 ENCOUNTER — Encounter
Admission: RE | Disposition: A | Discharge status: Home / Self Care | Payer: Self-pay | Source: Home / Self Care | Attending: Gastroenterology

## 2019-11-18 DIAGNOSIS — Z79899 Other long term (current) drug therapy: Secondary | ICD-10-CM | POA: Diagnosis not present

## 2019-11-18 DIAGNOSIS — F17210 Nicotine dependence, cigarettes, uncomplicated: Secondary | ICD-10-CM | POA: Insufficient documentation

## 2019-11-18 DIAGNOSIS — K621 Rectal polyp: Secondary | ICD-10-CM | POA: Diagnosis not present

## 2019-11-18 DIAGNOSIS — D122 Benign neoplasm of ascending colon: Secondary | ICD-10-CM | POA: Diagnosis not present

## 2019-11-18 DIAGNOSIS — K5730 Diverticulosis of large intestine without perforation or abscess without bleeding: Secondary | ICD-10-CM | POA: Insufficient documentation

## 2019-11-18 DIAGNOSIS — K635 Polyp of colon: Secondary | ICD-10-CM

## 2019-11-18 DIAGNOSIS — Z7982 Long term (current) use of aspirin: Secondary | ICD-10-CM | POA: Insufficient documentation

## 2019-11-18 DIAGNOSIS — Z8616 Personal history of COVID-19: Secondary | ICD-10-CM | POA: Diagnosis not present

## 2019-11-18 DIAGNOSIS — F329 Major depressive disorder, single episode, unspecified: Secondary | ICD-10-CM | POA: Diagnosis not present

## 2019-11-18 DIAGNOSIS — K629 Disease of anus and rectum, unspecified: Secondary | ICD-10-CM

## 2019-11-18 DIAGNOSIS — K219 Gastro-esophageal reflux disease without esophagitis: Secondary | ICD-10-CM | POA: Insufficient documentation

## 2019-11-18 DIAGNOSIS — Z1211 Encounter for screening for malignant neoplasm of colon: Secondary | ICD-10-CM

## 2019-11-18 HISTORY — PX: COLONOSCOPY WITH PROPOFOL: SHX5780

## 2019-11-18 SURGERY — COLONOSCOPY WITH PROPOFOL
Anesthesia: General

## 2019-11-18 MED ORDER — PROPOFOL 10 MG/ML IV BOLUS
INTRAVENOUS | Status: DC | PRN
  Administered 2019-11-18: 80 mg via INTRAVENOUS

## 2019-11-18 MED ORDER — PROPOFOL 500 MG/50ML IV EMUL
INTRAVENOUS | Status: DC | PRN
  Administered 2019-11-18: 130 ug/kg/min via INTRAVENOUS

## 2019-11-18 MED ORDER — SODIUM CHLORIDE 0.9 % IV SOLN
INTRAVENOUS | Status: DC
  Administered 2019-11-18: 20 mL/h via INTRAVENOUS

## 2019-11-18 MED ORDER — LIDOCAINE HCL (PF) 1 % IJ SOLN
INTRAMUSCULAR | Status: AC
  Administered 2019-11-18: 1 mL
  Filled 2019-11-18: qty 2

## 2019-11-18 MED ORDER — PROPOFOL 500 MG/50ML IV EMUL
INTRAVENOUS | Status: AC
  Filled 2019-11-18: qty 50

## 2019-11-18 MED ORDER — LIDOCAINE HCL (CARDIAC) PF 100 MG/5ML IV SOSY
PREFILLED_SYRINGE | INTRAVENOUS | Status: DC | PRN
  Administered 2019-11-18: 50 mg via INTRAVENOUS

## 2019-11-18 NOTE — Telephone Encounter (Signed)
Referral was sent to Dr. Dahlia Byes per Dr. Bonna Gains.

## 2019-11-18 NOTE — Anesthesia Postprocedure Evaluation (Signed)
Anesthesia Post Note  Patient: Shelley White  Procedure(s) Performed: COLONOSCOPY WITH PROPOFOL (N/A )  Patient location during evaluation: Endoscopy Anesthesia Type: General Level of consciousness: awake and alert Pain management: pain level controlled Vital Signs Assessment: post-procedure vital signs reviewed and stable Respiratory status: spontaneous breathing, nonlabored ventilation, respiratory function stable and patient connected to nasal cannula oxygen Cardiovascular status: blood pressure returned to baseline and stable Postop Assessment: no apparent nausea or vomiting Anesthetic complications: no   No complications documented.   Last Vitals:  Vitals:   11/18/19 1104 11/18/19 1114  BP: (!) 143/69 132/78  Pulse: 71 62  Resp: 17 20  Temp:    SpO2: 100% 100%    Last Pain:  Vitals:   11/18/19 1114  TempSrc:   PainSc: 0-No pain                 Martha Clan

## 2019-11-18 NOTE — Anesthesia Preprocedure Evaluation (Signed)
Anesthesia Evaluation  Patient identified by MRN, date of birth, ID band Patient awake    Reviewed: Allergy & Precautions, NPO status , Patient's Chart, lab work & pertinent test results  History of Anesthesia Complications Negative for: history of anesthetic complications  Airway Mallampati: II       Dental  (+) Dental Advidsory Given, Partial Upper, Teeth Intact, Missing   Pulmonary neg sleep apnea, neg COPD, Current Smoker and Patient abstained from smoking.,           Cardiovascular (-) hypertension(-) Past MI and (-) CHF (-) pacemaker(-) Valvular Problems/Murmurs     Neuro/Psych neg Seizures Depression    GI/Hepatic Neg liver ROS, GERD  Medicated and Poorly Controlled,  Endo/Other  neg diabetes  Renal/GU negative Renal ROS     Musculoskeletal   Abdominal   Peds  Hematology   Anesthesia Other Findings Past Medical History: No date: Arthritis     Comment:  left arm 10/2018: COVID-19 No date: Depression No date: GERD (gastroesophageal reflux disease)   Reproductive/Obstetrics                             Anesthesia Physical  Anesthesia Plan  ASA: II  Anesthesia Plan: General   Post-op Pain Management:    Induction: Intravenous  PONV Risk Score and Plan: 2 and Propofol infusion and TIVA  Airway Management Planned: Natural Airway and Nasal Cannula  Additional Equipment:   Intra-op Plan:   Post-operative Plan:   Informed Consent: I have reviewed the patients History and Physical, chart, labs and discussed the procedure including the risks, benefits and alternatives for the proposed anesthesia with the patient or authorized representative who has indicated his/her understanding and acceptance.       Plan Discussed with:   Anesthesia Plan Comments:         Anesthesia Quick Evaluation

## 2019-11-18 NOTE — H&P (Signed)
Vonda Antigua, MD [address], [place], [place], [place], [id number], [place], [place], [place], [address] Phone: [phone]  Fax: [phone]  Primary Care Physician:  Bunnie Pion, FNP   Pre-Procedure History & Physical: HPI:  Shelley White is a 64 y.o. female is here for a colonoscopy.   Past Medical History:  Diagnosis Date  . Arthritis    left arm  . COVID-19 10/2018  . Depression   . GERD (gastroesophageal reflux disease)     Past Surgical History:  Procedure Laterality Date  . ABDOMINAL HYSTERECTOMY    . BREAST BIOPSY Right    ADH  . BREAST LUMPECTOMY Right 2007   ADH  . CYSTOSCOPY WITH BIOPSY N/A 07/08/2019   Procedure: CYSTOSCOPY WITH  BIOPSY;  Surgeon: Billey Co, MD;  Location: [facility] ORS;  Service: Urology;  Laterality: N/A;  . TONSILLECTOMY     age 47  . URETEROSCOPY Bilateral 07/08/2019   Procedure: DIAGNOSTIC URETEROSCOPY;  Surgeon: Billey Co, MD;  Location: [facility] OR[facility];  Service: Urology;  Laterality: Bilateral;    Prior to Admission medications   Medication Sig Start Date End Date Taking? Authorizing Provider  acetaminophen (TYLENOL) 500 MG tablet Take 500 mg by mouth every 6 (six) hours as needed.   Yes [provider]  aspirin EC 325 MG tablet Take 325 mg by mouth daily.    Yes [provider]  buPROPion (WELLBUTRIN XL) 150 MG 24 hr tablet Take 150 mg by mouth every morning.    Yes [provider]  cetirizine (ZYRTEC) 10 MG tablet Take 10 mg by mouth at bedtime.    Yes [provider]  hydrOXYzine (ATARAX/VISTARIL) 25 MG tablet Take 25 mg by mouth at bedtime.  05/04/19  Yes [provider]  Omega-3 Fatty Acids (OMEGA 3 500) 500 MG CAPS Take 500 mg by mouth daily.   Yes [provider]  omeprazole (PRILOSEC) 20 MG capsule Take 20 mg by mouth 2 (two) times daily before a meal.  05/05/19  Yes [provider]  tamsulosin (FLOMAX) 0.4 MG CAPS capsule Take 1  capsule (0.4 mg total) by mouth daily after supper. 07/08/19  Yes Billey Co, MD  zinc gluconate 50 MG tablet Take 50 mg by mouth daily.   Yes [provider]    Allergies as of 10/25/2019 - Review Complete 07/14/2019  Allergen Reaction Noted  . Codeine Itching and Swelling 12/27/2014    Family History  Problem Relation Age of Onset  . Breast cancer Neg Hx     Social History   Socioeconomic History  . Marital status: Married    Spouse name: Not on file  . Number of children: Not on file  . Years of education: Not on file  . Highest education level: Not on file  Occupational History  . Not on file  Tobacco Use  . Smoking status: Current Every Day Smoker    Packs/day: 1.00    Years: 30.00    Pack years: 30.00    Types: Cigarettes  . Smokeless tobacco: Never Used  Vaping Use  . Vaping Use: Never used  Substance and Sexual Activity  . Alcohol use: No  . Drug use: No  . Sexual activity: Yes    Birth control/protection: Post-menopausal  Other Topics Concern  . Not on file  Social History Narrative  . Not on file   Social Determinants of Health   Financial Resource Strain:   . Difficulty of  Paying Living Expenses:   Food Insecurity:   . Worried About Charity fundraiser in the Last Year:   . Arboriculturist in the Last Year:   Transportation Needs:   . Film/video editor (Medical):   Marland Kitchen Lack of Transportation (Non-Medical):   Physical Activity:   . Days of Exercise per Week:   . Minutes of Exercise per Session:   Stress:   . Feeling of Stress :   Social Connections:   . Frequency of Communication with Friends and Family:   . Frequency of Social Gatherings with Friends and Family:   . Attends Religious Services:   . Active Member of Clubs or Organizations:   . Attends Archivist Meetings:   Marland Kitchen Marital Status:   Intimate Partner Violence:   . Fear of Current or Ex-Partner:   . Emotionally Abused:   Marland Kitchen Physically Abused:   . Sexually  Abused:     Review of Systems: See HPI, otherwise negative ROS  Physical Exam: BP 119/76   Pulse 77   Temp (!) 96.1 F (35.6 C) (Skin)   Resp 20   Ht 5\' 6"  (1.676 m)   Wt 70.3 kg   SpO2 100%   BMI 25.02 kg/m  General:   Alert,  pleasant and cooperative in NAD Head:  Normocephalic and atraumatic. Neck:  Supple; no masses or thyromegaly. Lungs:  Clear throughout to auscultation, normal respiratory effort.    Heart:  +S1, +S2, Regular rate and rhythm, No edema. Abdomen:  Soft, nontender and nondistended. Normal bowel sounds, without guarding, and without rebound.   Neurologic:  Alert and  oriented x4;  grossly normal neurologically.  Impression/Plan: Shelley White is here for a colonoscopy to be performed for average risk screening.  Risks, benefits, limitations, and alternatives regarding  colonoscopy have been reviewed with the patient.  Questions have been answered.  All parties agreeable.   Virgel Manifold, MD  11/18/2019, 9:44 AM

## 2019-11-18 NOTE — Transfer of Care (Signed)
Immediate Anesthesia Transfer of Care Note  Patient: Shelley White  Procedure(s) Performed: COLONOSCOPY WITH PROPOFOL (N/A )  Patient Location: PACU  Anesthesia Type:General  Level of Consciousness: drowsy  Airway & Oxygen Therapy: Patient Spontanous Breathing  Post-op Assessment: Report given to RN and Post -op Vital signs reviewed and stable  Post vital signs: Reviewed and stable  Last Vitals:  Vitals Value Taken Time  BP 99/67 11/18/19 1044  Temp 36.7 C 11/18/19 1044  Pulse 71 11/18/19 1045  Resp 14 11/18/19 1045  SpO2 98 % 11/18/19 1045  Vitals shown include unvalidated device data.  Last Pain:  Vitals:   11/18/19 1044  TempSrc: Tympanic  PainSc: Asleep         Complications: No complications documented.

## 2019-11-18 NOTE — Op Note (Signed)
[facility] Patient Name: Shelley White Procedure Date: 11/18/2019 9:40 AM MRN: [id number] Account #: [id number] Date of Birth: March 02, 1956 Admit Type: Outpatient Age: 64 Room: [facility] ENDO ROOM 4 Gender: Female Note Status: Finalized Procedure:             Colonoscopy Indications:           Screening for colorectal malignant neoplasm Providers:             Jye Fariss B. Bonna Gains MD, MD Referring MD:          Hendricks Milo Medicines:             Monitored Anesthesia Care Complications:         No immediate complications. Procedure:             Pre-Anesthesia Assessment:                        - ASA Grade Assessment: II - A patient with mild                         systemic disease.                        - Prior to the procedure, a History and Physical was                         performed, and patient medications, allergies and                         sensitivities were reviewed. The patient's tolerance                         of previous anesthesia was reviewed.                        - The risks and benefits of the procedure and the                         sedation options and risks were discussed with the                         patient. All questions were answered and informed                         consent was obtained.                        - Patient identification and proposed procedure were                         verified prior to the procedure by the physician, the                         nurse, the anesthesiologist, the anesthetist and the                         technician. The procedure was verified in the                         procedure room.  After obtaining informed consent, the colonoscope was                         passed under direct vision. Throughout the procedure,                         the patient's blood pressure, pulse, and oxygen                         saturations were monitored continuously. The                          Colonoscope was introduced through the anus and                         advanced to the the cecum, identified by appendiceal                         orifice and ileocecal valve. The colonoscopy was                         performed with ease. The patient tolerated the                         procedure well. The quality of the bowel preparation                         was good. Findings:      The perianal and digital rectal examinations were normal.      A 6 mm polyp was found in the ascending colon. The polyp was sessile.       The polyp was removed with a cold snare. Resection and retrieval were       complete.      Multiple diverticula were found in the sigmoid colon.      The exam was otherwise without abnormality.      The rectum, sigmoid colon, descending colon, transverse colon, ascending       colon and cecum appeared normal.      A 4 mm polypoid lesion was found in the rectum (on retroflexion). The       lesion was polypoid. No bleeding was present. Impression:            - One 6 mm polyp in the ascending colon, removed with                         a cold snare. Resected and retrieved.                        - Diverticulosis in the sigmoid colon.                        - The examination was otherwise normal.                        - The rectum, sigmoid colon, descending colon,                         transverse colon, ascending colon and cecum are normal.                        -  Likely benign polypoid lesion in the rectum. Recommendation:        - Refer to a surgeon for evaluation of polypoid lesion                         in rectum.                        - Discharge patient to home (with escort).                        - Advance diet as tolerated.                        - Continue present medications.                        - Await pathology results.                        - Repeat colonoscopy in 5 years.                        - The findings and  recommendations were discussed with                         the patient.                        - The findings and recommendations were discussed with                         the patient's family.                        - Return to primary care physician as previously                         scheduled.                        - High fiber diet. Procedure Code(s):     --- Professional ---                        [phone], Colonoscopy, flexible; with removal of                         tumor(s), polyp(s), or other lesion(s) by snare                         technique Diagnosis Code(s):     --- Professional ---                        Z12.11, Encounter for screening for malignant neoplasm                         of colon                        K63.5, Polyp of colon CPT copyright 2019 American Medical Association. All rights reserved. The codes documented in this report are preliminary and upon coder review may  be revised to meet current compliance requirements.  Vonda Antigua, MD Margretta Sidle B. Bonna Gains MD, MD 11/18/2019 10:43:45 AM This report has been signed electronically. Number of Addenda: 0 Note Initiated On: 11/18/2019 9:40 AM Scope Withdrawal Time: 0 hours 32 minutes 26 seconds  Total Procedure Duration: 0 hours 37 minutes 58 seconds  Estimated Blood Loss:  Estimated blood loss: none.      [facility]

## 2019-11-18 NOTE — Telephone Encounter (Signed)
-----   Message from Virgel Manifold, MD sent at 11/18/2019 10:45 AM EDT ----- Refer to Dr. Dahlia Byes for polypoid lesion in the rectum

## 2019-11-21 ENCOUNTER — Encounter: Payer: Self-pay | Admitting: Gastroenterology

## 2019-11-21 LAB — SURGICAL PATHOLOGY

## 2019-11-29 ENCOUNTER — Encounter: Payer: Self-pay | Admitting: Gastroenterology

## 2019-11-30 ENCOUNTER — Other Ambulatory Visit: Payer: Self-pay

## 2019-11-30 ENCOUNTER — Ambulatory Visit: Payer: Medicare HMO | Admitting: Surgery

## 2019-11-30 ENCOUNTER — Encounter: Payer: Self-pay | Admitting: Surgery

## 2019-11-30 VITALS — BP 127/78 | HR 93 | Temp 98.7°F | Ht 66.0 in | Wt 157.0 lb

## 2019-11-30 DIAGNOSIS — K621 Rectal polyp: Secondary | ICD-10-CM

## 2019-11-30 NOTE — Patient Instructions (Addendum)
Take a daily fiber supplement like Benefiber, Citrucel, or Metamucil to help with constipation. You should be having at least one bowel movement a day. Increase your water intake.   Do a sitz bath one to two times a day for itching and comfort.   Follow up here in one month.    How to Take a [employer] A sitz bath is a warm water bath that may be used to care for your rectum, genital area, or the area between your rectum and genitals (perineum). For a sitz bath, the water only comes up to your hips and covers your buttocks. A sitz bath may done at home in a bathtub or with a portable sitz bath that fits over the toilet. Your health care provider may recommend a sitz bath to help:  Relieve pain and discomfort after delivering a baby.  Relieve pain and itching from hemorrhoids or anal fissures.  Relieve pain after certain surgeries.  Relax muscles that are sore or tight. How to take a sitz bath Take 3-4 sitz baths a day, or as many as told by your health care provider. Bathtub sitz bath To take a sitz bath in a bathtub: 1. Partially fill a bathtub with warm water. The water should be deep enough to cover your hips and buttocks when you are sitting in the tub. 2. If your health care provider told you to put medicine in the water, follow his or her instructions. 3. Sit in the water. 4. Open the tub drain a little, and leave it open during your bath. 5. Turn on the warm water again, enough to replace the water that is draining out. Keep the water running throughout your bath. This helps keep the water at the right level and the right temperature. 6. Soak in the water for 15-20 minutes, or as long as told by your health care provider. 7. When you are done, be careful when you stand up. You may feel dizzy. 8. After the sitz bath, pat yourself dry. Do not rub your skin to dry it.  Over-the-toilet sitz bath To take a sitz bath with an over-the-toilet basin: 1. Follow the manufacturer's  instructions. 2. Fill the basin with warm water. 3. If your health care provider told you to put medicine in the water, follow his or her instructions. 4. Sit on the seat. Make sure the water covers your buttocks and perineum. 5. Soak in the water for 15-20 minutes, or as long as told by your health care provider. 6. After the sitz bath, pat yourself dry. Do not rub your skin to dry it. 7. Clean and dry the basin between uses. 8. Discard the basin if it cracks, or according to the manufacturer's instructions. Contact a health care provider if:  Your symptoms get worse. Do not continue with sitz baths if your symptoms get worse.  You have new symptoms. If this happens, do not continue with sitz baths until you talk with your health care provider. Summary  A sitz bath is a warm water bath in which the water only comes up to your hips and covers your buttocks.  A sitz bath may help relieve itching, relieve pain, and relax muscles that are sore or tight in the lower part of your body, including your genital area.  Take 3-4 sitz baths a day, or as many as told by your health care provider. Soak in the water for 15-20 minutes.  Do not continue with sitz baths if your symptoms get  worse. This information is not intended to replace advice given to you by your health care provider. Make sure you discuss any questions you have with your health care provider. Document Revised: 08/30/2018 Document Reviewed: 04/02/2017 Elsevier Patient Education  [employer].

## 2019-11-30 NOTE — Progress Notes (Signed)
Patient ID: Shelley White, female   DOB: 1955/06/18, 64 y.o.   MRN: [id number]  HPI Shelley White is a 64 y.o. female in consultation at the request of Dr. Bonna Gains for possible lesion found in the anal canal during colonoscopy.  Please note that I have personally reviewed the images showing evidence of a polypoid lesion at the junction between the canal and the anal margin.  She also had diverticulosis.  She describes some occasional itching.  No hematochezia no melena no weight loss.  She did have some hematuria and had cystogram showing no major issues.  Her hematuria has improved.  No family history of colorectal cancer. I have also personally reviewed the CT scan showing a left kidney stone.  No acute major intra-abdominal abnormalities. CBC and CMP were completely normal.  She is able to perform more than 4 METS of activity without any shortness of breath or chest pain.  He denies any anorectal pain.  No fevers no chills no weight loss no respiratory symptoms.   HPI  Past Medical History:  Diagnosis Date  . Arthritis    left arm  . COVID-19 10/2018  . Depression   . GERD (gastroesophageal reflux disease)     Past Surgical History:  Procedure Laterality Date  . ABDOMINAL HYSTERECTOMY    . BREAST BIOPSY Right    ADH  . BREAST LUMPECTOMY Right 2007   ADH  . COLONOSCOPY WITH PROPOFOL N/A 11/18/2019   Procedure: COLONOSCOPY WITH PROPOFOL;  Surgeon: Virgel Manifold, MD;  Location: [facility] ENDOSCOPY;  Service: Endoscopy;  Laterality: N/A;  . CYSTOSCOPY WITH BIOPSY N/A 07/08/2019   Procedure: CYSTOSCOPY WITH  BIOPSY;  Surgeon: Billey Co, MD;  Location: [facility] ORS;  Service: Urology;  Laterality: N/A;  . TONSILLECTOMY     age 79  . URETEROSCOPY Bilateral 07/08/2019   Procedure: DIAGNOSTIC URETEROSCOPY;  Surgeon: Billey Co, MD;  Location: [facility] ORS;  Service: Urology;  Laterality: Bilateral;    Family History  Problem Relation Age of Onset  . Breast cancer Neg Hx      Social History Social History   Tobacco Use  . Smoking status: Current Every Day Smoker    Packs/day: 1.00    Years: 30.00    Pack years: 30.00    Types: Cigarettes  . Smokeless tobacco: Never Used  Vaping Use  . Vaping Use: Never used  Substance Use Topics  . Alcohol use: No  . Drug use: No    Allergies  Allergen Reactions  . Codeine Itching and Swelling    Current Outpatient Medications  Medication Sig Dispense Refill  . acetaminophen (TYLENOL) 500 MG tablet Take 500 mg by mouth every 6 (six) hours as needed.    Marland Kitchen aspirin EC 81 MG tablet Take 81 mg by mouth daily. Swallow whole.    Marland Kitchen buPROPion (WELLBUTRIN XL) 150 MG 24 hr tablet Take 150 mg by mouth in the morning and at bedtime.     . cetirizine (ZYRTEC) 10 MG tablet Take 10 mg by mouth at bedtime.     . hydrOXYzine (ATARAX/VISTARIL) 25 MG tablet Take 25 mg by mouth at bedtime.     . Omega-3 Fatty Acids (OMEGA 3 500) 500 MG CAPS Take 500 mg by mouth daily.    Marland Kitchen omeprazole (PRILOSEC) 20 MG capsule Take 20 mg by mouth 2 (two) times daily before a meal.     . zinc gluconate 50 MG tablet Take 50 mg by mouth daily.  Current Facility-Administered Medications  Medication Dose Route Frequency Provider Last Rate Last Admin  . betamethasone acetate-betamethasone sodium phosphate (CELESTONE) injection 3 mg  3 mg Intramuscular Once Edrick Kins, DPM         Review of Systems Full ROS  was asked and was negative except for the information on the HPI  Physical Exam Blood pressure 127/78, pulse 93, temperature 98.7 F (37.1 C), height 5\' 6"  (1.676 m), weight 157 lb (71.2 kg), SpO2 98 %. CONSTITUTIONAL: NAD EYES: Pupils are equal, round, and reactive to light, Sclera are non-icteric. EARS, NOSE, MOUTH AND THROAT: She is wearing a mask. hearing is intact to voice. LYMPH NODES:  Lymph nodes in the neck are normal. RESPIRATORY:  Lungs are clear. There is normal respiratory effort, with equal breath sounds bilaterally, and  without pathologic use of accessory muscles. CARDIOVASCULAR: Heart is regular without murmurs, gallops, or rubs. GI: The abdomen is soft, nontender, and nondistended. There are no palpable masses. There is no hepatosplenomegaly. There are normal bowel sounds in all quadrants. Rectal: There is evidence of polypoid lesion/hemorrhoidal tissue within the anal canal.  This does not seem to be suspicious for malignancy.  No other rectal masses. MUSCULOSKELETAL: Normal muscle strength and tone. No cyanosis or edema.   SKIN: Turgor is good and there are no pathologic skin lesions or ulcers. NEUROLOGIC: Motor and sensation is grossly normal. Cranial nerves are grossly intact. PSYCH:  Oriented to person, place and time. Affect is normal.  Data Reviewed  I have personally reviewed the patient's imaging, laboratory findings and medical records.    Assessment/Plan [name] is a 64 year old female with symptomatic rectal polyp.  Discussed with the  patient in detail about her disease process.  I do not think this area this is a cancerous process.  Gave the option of observation versus excision .  She wishes to get this excised eventually, but in the immediate future she is hesitant.  She wishes to try medical therapy with sitz bath stool softener and increasing water intake.  We will see her back in a few weeks and if at that time if there is no improvement excision will be recommended.Caroleen Hamman, MD FACS General Surgeon 11/30/2019, 2:11 PM

## 2019-12-07 ENCOUNTER — Encounter: Payer: Self-pay | Admitting: Surgery

## 2019-12-26 ENCOUNTER — Ambulatory Visit: Payer: Medicare HMO | Admitting: Surgery

## 2020-01-26 ENCOUNTER — Encounter: Payer: Self-pay | Admitting: *Deleted

## 2020-02-20 ENCOUNTER — Encounter (HOSPITAL_COMMUNITY): Payer: Self-pay | Admitting: Emergency Medicine

## 2020-02-20 ENCOUNTER — Other Ambulatory Visit: Payer: Self-pay

## 2020-02-20 ENCOUNTER — Emergency Department (HOSPITAL_COMMUNITY): Payer: Medicare HMO

## 2020-02-20 ENCOUNTER — Emergency Department (HOSPITAL_COMMUNITY)
Admission: EM | Admit: 2020-02-20 | Discharge: 2020-02-20 | Disposition: A | Discharge status: Home / Self Care | Payer: Medicare HMO | Attending: Emergency Medicine | Admitting: Emergency Medicine

## 2020-02-20 DIAGNOSIS — Z7982 Long term (current) use of aspirin: Secondary | ICD-10-CM | POA: Diagnosis not present

## 2020-02-20 DIAGNOSIS — Z8616 Personal history of COVID-19: Secondary | ICD-10-CM | POA: Diagnosis not present

## 2020-02-20 DIAGNOSIS — R202 Paresthesia of skin: Secondary | ICD-10-CM | POA: Diagnosis not present

## 2020-02-20 DIAGNOSIS — R519 Headache, unspecified: Secondary | ICD-10-CM | POA: Diagnosis not present

## 2020-02-20 DIAGNOSIS — F17210 Nicotine dependence, cigarettes, uncomplicated: Secondary | ICD-10-CM | POA: Diagnosis not present

## 2020-02-20 HISTORY — DX: Anxiety disorder, unspecified: F41.9

## 2020-02-20 LAB — URINALYSIS, ROUTINE W REFLEX MICROSCOPIC
Bilirubin Urine: NEGATIVE
Glucose, UA: NEGATIVE mg/dL
Hgb urine dipstick: NEGATIVE
Ketones, ur: NEGATIVE mg/dL
Leukocytes,Ua: NEGATIVE
Nitrite: NEGATIVE
Protein, ur: NEGATIVE mg/dL
Specific Gravity, Urine: 1.008 (ref 1.005–1.030)
pH: 7 (ref 5.0–8.0)

## 2020-02-20 NOTE — Discharge Instructions (Addendum)
It is not clear what caused your numbness today.  It could be anxiety, but we cannot rule out TIA or stroke as the source of this problem.  Since she got better it is less likely that this is a stroke.  We discussed staying here and doing further testing but you are choosing to leave and follow-up with your primary care doctor for further evaluation.  Call her in the morning to schedule appointment to be seen as soon as possible to discuss the numb feeling.  If your symptoms return and/or get worse, return immediately to the emergency department.  Continue taking your usual medicine for anxiety.  Make sure you are getting plenty of rest and drinking a lot of fluids.

## 2020-02-20 NOTE — ED Provider Notes (Signed)
[facility] EMERGENCY DEPARTMENT Provider Note   CSN: [id number] Arrival date & time: 02/20/20  1222     History Chief Complaint  Patient presents with  . Tingling    Shelley White is a 64 y.o. female.  HPI She presents for evaluation of a sensation of numbness that she noticed in the fingers of her left hand and her left foot.  She noticed these symptoms upon awakening this morning.  She also had a mild headache, felt in the back of her head.  No recent trauma.  No similar problems in the past.  The headache and numb feeling, resolved around 6 PM tonight.  She denies chest pain, shortness of breath, focal weakness or dizziness.  She denies change in vision, taste or smell.  No other recent illnesses.  There are no other known modifying factors.    Past Medical History:  Diagnosis Date  . Anxiety   . Arthritis    left arm  . COVID-19 10/2018  . Depression   . GERD (gastroesophageal reflux disease)     Patient Active Problem List   Diagnosis Date Noted  . Encounter for screening colonoscopy   . Polyp of colon   . Hematuria 01/03/2019  . Chest pain 12/08/2017    Past Surgical History:  Procedure Laterality Date  . ABDOMINAL HYSTERECTOMY    . BREAST BIOPSY Right    ADH  . BREAST LUMPECTOMY Right 2007   ADH  . COLONOSCOPY WITH PROPOFOL N/A 11/18/2019   Procedure: COLONOSCOPY WITH PROPOFOL;  Surgeon: Virgel Manifold, MD;  Location: [facility] ENDOSCOPY;  Service: Endoscopy;  Laterality: N/A;  . CYSTOSCOPY WITH BIOPSY N/A 07/08/2019   Procedure: CYSTOSCOPY WITH  BIOPSY;  Surgeon: Billey Co, MD;  Location: [facility] ORS;  Service: Urology;  Laterality: N/A;  . TONSILLECTOMY     age 16  . URETEROSCOPY Bilateral 07/08/2019   Procedure: DIAGNOSTIC URETEROSCOPY;  Surgeon: Billey Co, MD;  Location: [facility] ORS;  Service: Urology;  Laterality: Bilateral;     OB History    Gravida  1   Para  1   Term  1   Preterm      AB      Living  1     SAB      TAB       Ectopic      Multiple      Live Births              Family History  Problem Relation Age of Onset  . Breast cancer Neg Hx     Social History   Tobacco Use  . Smoking status: Current Every Day Smoker    Packs/day: 1.00    Years: 30.00    Pack years: 30.00    Types: Cigarettes  . Smokeless tobacco: Never Used  Vaping Use  . Vaping Use: Never used  Substance Use Topics  . Alcohol use: No  . Drug use: No    Home Medications Prior to Admission medications   Medication Sig Start Date End Date Taking? Authorizing Provider  aspirin EC 81 MG tablet Take 81 mg by mouth daily. Swallow whole.   Yes [provider]  buPROPion (WELLBUTRIN XL) 150 MG 24 hr tablet Take 150 mg by mouth in the morning and at bedtime.    Yes [provider]  cetirizine (ZYRTEC) 10 MG tablet Take 10 mg by mouth at bedtime.    Yes [provider]  hydrOXYzine (ATARAX/VISTARIL) 25  MG tablet Take 25 mg by mouth at bedtime.  05/04/19  Yes [provider]  Omega-3 Fatty Acids (OMEGA 3 500) 500 MG CAPS Take 500 mg by mouth daily.   Yes [provider]  zinc gluconate 50 MG tablet Take 50 mg by mouth daily.   Yes [provider]  azithromycin (ZITHROMAX) 250 MG tablet Take by mouth. Patient not taking: Reported on 02/20/2020 10/07/19   [provider]  [place] KIT 17.5-3.13-1.6 GM/177ML SOLN Take by mouth as directed. Patient not taking: Reported on 02/20/2020 10/25/19   [provider]    Allergies    Codeine  Review of Systems   Review of Systems  All other systems reviewed and are negative.   Physical Exam Updated Vital Signs BP (!) 112/95 (BP Location: Left Arm)   Pulse 84   Temp 98.2 F (36.8 C) (Oral)   Resp 18   Ht _0  (1.676 m)   Wt 72.6 kg   SpO2 96%   BMI 25.82 kg/m   Physical Exam Vitals and nursing note reviewed.  Constitutional:      General: She is not in acute distress.    Appearance: She is  well-developed. She is not ill-appearing, toxic-appearing or diaphoretic.  HENT:     Head: Normocephalic and atraumatic.     Right Ear: External ear normal.     Left Ear: External ear normal.  Eyes:     Conjunctiva/sclera: Conjunctivae normal.     Pupils: Pupils are equal, round, and reactive to light.  Neck:     Trachea: Phonation normal.  Cardiovascular:     Rate and Rhythm: Normal rate and regular rhythm.     Heart sounds: Normal heart sounds.  Pulmonary:     Effort: Pulmonary effort is normal. No respiratory distress.     Breath sounds: Normal breath sounds. No stridor. No rhonchi.  Abdominal:     General: There is no distension.     Palpations: Abdomen is soft.     Tenderness: There is no abdominal tenderness.  Musculoskeletal:        General: Normal range of motion.     Cervical back: Normal range of motion and neck supple.  Skin:    General: Skin is warm and dry.  Neurological:     Mental Status: She is alert and oriented to person, place, and time.     Cranial Nerves: No cranial nerve deficit.     Sensory: No sensory deficit.     Motor: No abnormal muscle tone.     Coordination: Coordination normal.     Comments: No dysarthria or aphasia.  No nystagmus.  Normal gait.  No ataxia.  No pronator drift.  Psychiatric:        Mood and Affect: Mood normal.        Behavior: Behavior normal.        Thought Content: Thought content normal.        Judgment: Judgment normal.     ED Results / Procedures / Treatments   Labs (all labs ordered are listed, but only abnormal results are displayed) Labs Reviewed  URINALYSIS, ROUTINE W REFLEX MICROSCOPIC    EKG None  Radiology DG Chest 2 View  Result Date: 02/20/2020 CLINICAL DATA:  Shortness of breath and pain EXAM: CHEST - 2 VIEW COMPARISON:  November 22, 2018 FINDINGS: Lungs are clear. Heart size and pulmonary vascularity are normal. No adenopathy. No pneumothorax. There is mild degenerative change in the thoracic  spine.  IMPRESSION: Lungs clear.  Cardiac silhouette normal. Electronically Signed   By: Lowella Grip III M.D.   On: 02/20/2020 14:02    Procedures Procedures (including critical care time)  Medications Ordered in ED Medications - No data to display  ED Course  I have reviewed the triage vital signs and the nursing notes.  Pertinent labs & imaging results that were available during my care of the patient were reviewed by me and considered in my medical decision making (see chart for details).    MDM Rules/Calculators/A&P                           Patient Vitals for the past 24 hrs:  BP Temp Temp src Pulse Resp SpO2 Height Weight  02/20/20 2004 (!) 112/95 98.2 F (36.8 C) Oral 84 18 96 % -- --  02/20/20 1346 139/75 98.3 F (36.8 C) Oral 81 18 98 % -- --  02/20/20 1308 -- -- -- -- -- -- _0  (1.676 m) 72.6 kg  02/20/20 1303 (!) 169/86 98.7 F (37.1 C) Oral 93 18 96 % -- --    9:53 PM Reevaluation with update and discussion. After initial assessment and treatment, an updated evaluation reveals no change in clinical status.  I offered to do further testing, and/or admit the patient to do a complete neurologic evaluation.  Patient declined.  She wanted to follow-up with her primary care doctor before going to this extent.  She is currently symptom-free. Daleen Bo   Medical Decision Making:  This patient is presenting for evaluation of numbness, which does require a range of treatment options, and is a complaint that involves a moderate risk of morbidity and mortality. The differential diagnoses include anxiety, peripheral neuropathy, TIA, CVA. I decided to review old records, and in summary healthy middle-aged female with history of anxiety, but no cardiac, neurologic, or blood pressure disorders.  I did not require additional historical information from anyone.  Clinical Laboratory Tests Ordered, included Urinalysis. Review indicates normal. Radiologic Tests Ordered, included  chest x-ray.  I independently Visualized: Radiographic images, which show normal    Critical Interventions-clinical evaluation, chest x-ray, EKG, urinalysis, observation reassessment  After These Interventions, the Patient was reevaluated and was found comfortable without recurrence of symptoms.  Differential diagnosis discussed with the patient, including anxiety, and stroke syndrome.  Patient prefers to follow-up with her PCP for further evaluation as needed.  She understands that she can return anytime for further testing.  CRITICAL CARE-no Performed by: Daleen Bo  Nursing Notes Reviewed/ Care Coordinated Applicable Imaging Reviewed Interpretation of Laboratory Data incorporated into ED treatment  The patient appears reasonably screened and/or stabilized for discharge and I doubt any other medical condition or other [facility] requiring further screening, evaluation, or treatment in the ED at this time prior to discharge.  Plan: Home Medications-continue current; Home Treatments-rest, fluids; return here if the recommended treatment, does not improve the symptoms; Recommended follow up-PCP follow-up as soon as possible for further evaluation and consideration for more directed evaluation.     Final Clinical Impression(s) / ED Diagnoses Final diagnoses:  None    Rx / DC Orders ED Discharge Orders    None       Daleen Bo, MD 02/20/20 2156

## 2020-02-20 NOTE — ED Triage Notes (Signed)
Patient c/o tingling in left arm and leg. Per patient started as some pain in her upper and lower back pain last night. Patient states tingling was noted when she woke this morning at 7am. Patient went to sleep at 10:30pm. Denies any dizziness, slurred speech, facial drooping or weakness. Patient does state mild headache. Denies any chest pain but reports some shortness of breath. Patient reports hx of anxiety.

## 2020-02-21 ENCOUNTER — Emergency Department
Admission: EM | Admit: 2020-02-21 | Discharge: 2020-02-21 | Disposition: A | Discharge status: Home / Self Care | Payer: Medicare HMO | Attending: Emergency Medicine | Admitting: Emergency Medicine

## 2020-02-21 ENCOUNTER — Emergency Department: Payer: Medicare HMO

## 2020-02-21 ENCOUNTER — Encounter: Payer: Self-pay | Admitting: Emergency Medicine

## 2020-02-21 ENCOUNTER — Other Ambulatory Visit: Payer: Self-pay

## 2020-02-21 DIAGNOSIS — Z8616 Personal history of COVID-19: Secondary | ICD-10-CM | POA: Insufficient documentation

## 2020-02-21 DIAGNOSIS — R202 Paresthesia of skin: Secondary | ICD-10-CM | POA: Diagnosis not present

## 2020-02-21 DIAGNOSIS — Z7982 Long term (current) use of aspirin: Secondary | ICD-10-CM | POA: Diagnosis not present

## 2020-02-21 DIAGNOSIS — R531 Weakness: Secondary | ICD-10-CM | POA: Diagnosis present

## 2020-02-21 DIAGNOSIS — F419 Anxiety disorder, unspecified: Secondary | ICD-10-CM | POA: Diagnosis not present

## 2020-02-21 DIAGNOSIS — F17210 Nicotine dependence, cigarettes, uncomplicated: Secondary | ICD-10-CM | POA: Insufficient documentation

## 2020-02-21 LAB — COMPREHENSIVE METABOLIC PANEL
ALT: 18 U/L (ref 0–44)
AST: 21 U/L (ref 15–41)
Albumin: 4.2 g/dL (ref 3.5–5.0)
Alkaline Phosphatase: 80 U/L (ref 38–126)
Anion gap: 10 (ref 5–15)
BUN: 11 mg/dL (ref 8–23)
CO2: 23 mmol/L (ref 22–32)
Calcium: 8.6 mg/dL — ABNORMAL LOW (ref 8.9–10.3)
Chloride: 105 mmol/L (ref 98–111)
Creatinine, Ser: 0.82 mg/dL (ref 0.44–1.00)
GFR, Estimated: >60 mL/min (ref >60)
Glucose, Bld: 114 mg/dL — ABNORMAL HIGH (ref 70–99)
Potassium: 3.5 mmol/L (ref 3.5–5.1)
Sodium: 138 mmol/L (ref 135–145)
Total Bilirubin: 0.8 mg/dL (ref 0.3–1.2)
Total Protein: 7.3 g/dL (ref 6.5–8.1)

## 2020-02-21 LAB — CBC
HCT: 37.3 % (ref 36.0–46.0)
Hemoglobin: 12.7 g/dL (ref 12.0–15.0)
MCH: 28.6 pg (ref 26.0–34.0)
MCHC: 34 g/dL (ref 30.0–36.0)
MCV: 84 fL (ref 80.0–100.0)
Platelets: 265 10*3/uL (ref 150–400)
RBC: 4.44 MIL/uL (ref 3.87–5.11)
RDW: 13.2 % (ref 11.5–15.5)
WBC: 5.7 10*3/uL (ref 4.0–10.5)
nRBC: 0 % (ref 0.0–0.2)

## 2020-02-21 LAB — DIFFERENTIAL
Abs Immature Granulocytes: 0.02 10*3/uL (ref 0.00–0.07)
Basophils Absolute: 0 10*3/uL (ref 0.0–0.1)
Basophils Relative: 0 %
Eosinophils Absolute: 0.2 10*3/uL (ref 0.0–0.5)
Eosinophils Relative: 3 %
Immature Granulocytes: 0 %
Lymphocytes Relative: 28 %
Lymphs Abs: 1.6 10*3/uL (ref 0.7–4.0)
Monocytes Absolute: 0.3 10*3/uL (ref 0.1–1.0)
Monocytes Relative: 5 %
Neutro Abs: 3.6 10*3/uL (ref 1.7–7.7)
Neutrophils Relative %: 64 %

## 2020-02-21 LAB — TROPONIN I (HIGH SENSITIVITY): Troponin I (High Sensitivity): 3 ng/L (ref <18)

## 2020-02-21 LAB — APTT: aPTT: 31 seconds (ref 24–36)

## 2020-02-21 LAB — PROTIME-INR
INR: 1 (ref 0.8–1.2)
Prothrombin Time: 12.7 seconds (ref 11.4–15.2)

## 2020-02-21 MED ORDER — HYDROXYZINE HCL 25 MG PO TABS: 25.0000 mg | ORAL_TABLET | Freq: Three times a day (TID) | ORAL | 0 refills | Status: DC | PRN

## 2020-02-21 MED ORDER — HYDROXYZINE HCL 25 MG PO TABS
25.0000 mg | ORAL_TABLET | Freq: Once | ORAL | Status: AC
  Administered 2020-02-21: 25 mg via ORAL
  Filled 2020-02-21: qty 1

## 2020-02-21 MED ORDER — BUPROPION HCL ER (XL) 150 MG PO TB24
150.0000 mg | ORAL_TABLET | Freq: Once | ORAL | Status: AC
  Administered 2020-02-21: 150 mg via ORAL
  Filled 2020-02-21: qty 1

## 2020-02-21 NOTE — ED Notes (Signed)
Patient transported to CT 

## 2020-02-21 NOTE — ED Notes (Signed)
Scanner to sign not working

## 2020-02-21 NOTE — Discharge Instructions (Signed)
As we discussed, you have no signs of stroke and we did an MRI of your brain that did not show stroke.  As we discussed, there is still the possibility of TIA/mini stroke causing your symptoms.  While this is unlikely, it is important to follow-up with your primary care physician to discuss her continued symptoms.  Please make sure you go to that appointment next week.  In the meantime, if you develop any significantly worsening symptoms, strokelike symptoms, please return to the ED immediately.

## 2020-02-21 NOTE — ED Triage Notes (Signed)
Patient to ER for c/o left sided tingling to arm and leg with generalized weakness. Patient was seen at [facility] ER yesterday, but was unable to stay due to having her 66 year old mother in the car. Patient states she had chest pain yesterday and this am, but not currently. Patient reports also feeling lump in throat this am, but not currently. Patient denies having CT completed yesterday. Patient also reports tingling sensation to left eye, denies any vision changes. +HA yesterday, none currently.

## 2020-02-21 NOTE — ED Provider Notes (Signed)
[facility] Emergency Department Provider Note ____________________________________________   First MD Initiated Contact with Patient 02/21/20 1352     (approximate)  I have reviewed the triage vital signs and the nursing notes.  HISTORY  Chief Complaint Weakness   HPI Shelley White is a 64 y.o. femalewho presents to the ED for evaluation of weakness and left-sided paresthesias.  Chart review indicates patient seen yesterday in a nearby ED for left-sided extremity paresthesias.  She had a benign basic work-up without head imaging and resolution of symptoms while in the ED.  Discharged with PCP follow-up.  Patient reports setting up an outpatient visit with her PCP in 1 week's time, at the direction of the ED provider from yesterday, but she reports new concerns because of recurrence of the paresthesias to her left-sided fingertips.  Patient reports an episode yesterday that lasted a matter of minutes with isolated paresthesias to her left-sided fingertips.  She denies any associated pain, weakness to the left arm, and denies symptoms elsewhere.   She reports that she feels well now and has no symptoms.  She denies any recent trauma, falls, syncope.  She reports that she "sometimes" has difficulty holding objects in her left hand, but denies dropping items or changes to her handwriting.  Denies left leg weakness, trauma, paresthesias or other symptoms.  Past Medical History:  Diagnosis Date  . Anxiety   . Arthritis    left arm  . COVID-19 10/2018  . Depression   . GERD (gastroesophageal reflux disease)     Patient Active Problem List   Diagnosis Date Noted  . Encounter for screening colonoscopy   . Polyp of colon   . Hematuria 01/03/2019  . Chest pain 12/08/2017    Past Surgical History:  Procedure Laterality Date  . ABDOMINAL HYSTERECTOMY    . BREAST BIOPSY Right    ADH  . BREAST LUMPECTOMY Right 2007   ADH  . COLONOSCOPY WITH PROPOFOL N/A  11/18/2019   Procedure: COLONOSCOPY WITH PROPOFOL;  Surgeon: Virgel Manifold, MD;  Location: [facility] ENDOSCOPY;  Service: Endoscopy;  Laterality: N/A;  . CYSTOSCOPY WITH BIOPSY N/A 07/08/2019   Procedure: CYSTOSCOPY WITH  BIOPSY;  Surgeon: Billey Co, MD;  Location: [facility] ORS;  Service: Urology;  Laterality: N/A;  . TONSILLECTOMY     age 83  . URETEROSCOPY Bilateral 07/08/2019   Procedure: DIAGNOSTIC URETEROSCOPY;  Surgeon: Billey Co, MD;  Location: [facility] ORS;  Service: Urology;  Laterality: Bilateral;    Prior to Admission medications   Medication Sig Start Date End Date Taking? Authorizing Provider  aspirin EC 81 MG tablet Take 81 mg by mouth daily. Swallow whole.    [provider]  azithromycin (ZITHROMAX) 250 MG tablet Take by mouth. Patient not taking: Reported on 02/20/2020 10/07/19   [provider]  buPROPion (WELLBUTRIN XL) 150 MG 24 hr tablet Take 150 mg by mouth in the morning and at bedtime.     [provider]  cetirizine (ZYRTEC) 10 MG tablet Take 10 mg by mouth at bedtime.     [provider]  hydrOXYzine (ATARAX/VISTARIL) 25 MG tablet Take 25 mg by mouth at bedtime.  05/04/19   [provider]  hydrOXYzine (ATARAX/VISTARIL) 25 MG tablet Take 1 tablet (25 mg total) by mouth every 8 (eight) hours as needed for anxiety. 02/21/20   Vladimir Crofts, MD  Omega-3 Fatty Acids (OMEGA 3 500) 500 MG CAPS Take 500 mg by mouth daily.  [provider]  SUPREP BOWEL PREP KIT 17.5-3.13-1.6 GM/177ML SOLN Take by mouth as directed. Patient not taking: Reported on 02/20/2020 10/25/19   [provider]  zinc gluconate 50 MG tablet Take 50 mg by mouth daily.    [provider]    Allergies Codeine  Family History  Problem Relation Age of Onset  . Breast cancer Neg Hx     Social History Social History   Tobacco Use  . Smoking status: Current Every Day Smoker    Packs/day: 1.00    Years: 30.00    Pack years:  30.00    Types: Cigarettes  . Smokeless tobacco: Never Used  Vaping Use  . Vaping Use: Never used  Substance Use Topics  . Alcohol use: No  . Drug use: No    Review of Systems  Constitutional: No fever/chills Eyes: No visual changes. ENT: No sore throat. Cardiovascular: Denies chest pain. Respiratory: Denies shortness of breath. Gastrointestinal: No abdominal pain.  No nausea, no vomiting.  No diarrhea.  No constipation. Genitourinary: Negative for dysuria. Musculoskeletal: Negative for back pain. Skin: Negative for rash. Neurological: Negative for headaches, focal weakness or numbness.  Positive for paresthesias.  ____________________________________________   PHYSICAL EXAM:  VITAL SIGNS: Vitals:   02/21/20 1353 02/21/20 1642  BP: (!) 151/69 (!) 141/87  Pulse: 74 68  Resp: 18 18  Temp: 98.3 F (36.8 C) 97.6 F (36.4 C)  SpO2: 98% 100%     Constitutional: Alert and oriented. Well appearing and in no acute distress. Eyes: Conjunctivae are normal. PERRL. EOMI. Head: Atraumatic. Nose: No congestion/rhinnorhea. Mouth/Throat: Mucous membranes are moist.  Oropharynx non-erythematous. Neck: No stridor. No cervical spine tenderness to palpation. Cardiovascular: Normal rate, regular rhythm. Grossly normal heart sounds.  Good peripheral circulation. Respiratory: Normal respiratory effort.  No retractions. Lungs CTAB. Gastrointestinal: Soft , nondistended, nontender to palpation. No CVA tenderness. Musculoskeletal: No lower extremity tenderness nor edema.  No joint effusions. No signs of acute trauma. Neurologic:  Normal speech and language. No gross focal neurologic deficits are appreciated. No gait instability noted. Cranial nerves II through XII intact 5/5 strength and sensation in all 4 extremities Ambulatory independently with a normal gait. Skin:  Skin is warm, dry and intact. No rash noted. Psychiatric: Mood and affect are normal. Speech and behavior are  normal.  ____________________________________________   LABS (all labs ordered are listed, but only abnormal results are displayed)  Labs Reviewed  COMPREHENSIVE METABOLIC PANEL - Abnormal; Notable for the following components:      Result Value   Glucose, Bld 114 (*)    Calcium 8.6 (*)    All other components within normal limits  PROTIME-INR  APTT  CBC  DIFFERENTIAL  TROPONIN I (HIGH SENSITIVITY)   ____________________________________________  12 Lead EKG  Sinus rhythm, rate of 84 bpm.  Normal axis and intervals.  No evidence of acute ischemia. ____________________________________________  RADIOLOGY  ED MD interpretation: CT head reviewed by me without evidence of acute intracranial pathology.  Official radiology report(s): CT HEAD WO CONTRAST  Result Date: 02/21/2020 CLINICAL DATA:  Dizziness.  Left-sided numbness and tingling. EXAM: CT HEAD WITHOUT CONTRAST TECHNIQUE: Contiguous axial images were obtained from the base of the skull through the vertex without intravenous contrast. COMPARISON:  None. FINDINGS: Brain: No evidence of acute infarction, hemorrhage, hydrocephalus, extra-axial collection or mass lesion/mass effect. Vascular: Negative for hyperdense vessel Skull: Negative Sinuses/Orbits: Paranasal sinuses clear.  Negative orbit. Other: None IMPRESSION: Negative CT head Electronically Signed   By:  Franchot Gallo M.D.   On: 02/21/2020 11:41   MR BRAIN WO CONTRAST  Result Date: 02/21/2020 CLINICAL DATA:  Neuro deficit, acute stroke suspected. Left arm weakness and paresthesias. EXAM: MRI HEAD WITHOUT CONTRAST TECHNIQUE: Multiplanar, multiecho pulse sequences of the brain and surrounding structures were obtained without intravenous contrast. COMPARISON:  Same day head CT. FINDINGS: Brain: No acute infarction, hemorrhage, hydrocephalus, extra-axial collection or mass lesion. Mild, age-appropriate scattered T2/FLAIR hyperintensities, nonspecific but most likely the sequela  of chronic microvascular ischemic disease. Vascular: Major arterial flow voids are maintained at the skull base. Skull and upper cervical spine: Normal marrow signal. Sinuses/Orbits: Negative. Other: No mastoid effusions. IMPRESSION: No evidence of acute intracranial abnormality. Specifically, no acute infarct Electronically Signed   By: Margaretha Sheffield MD   On: 02/21/2020 15:56    ____________________________________________   PROCEDURES and INTERVENTIONS  Procedure(s) performed (including Critical Care):  Procedures  Medications  buPROPion (WELLBUTRIN XL) 24 hr tablet 150 mg (150 mg Oral Given 02/21/20 1446)  hydrOXYzine (ATARAX/VISTARIL) tablet 25 mg (25 mg Oral Given 02/21/20 1448)    ____________________________________________   MDM / ED COURSE   Patient presents to the ED with isolated paresthesias to her left hand without evidence of acute pathology and amenable to outpatient management.  Normal vitals on room air.  Exam without evidence of neurovascular deficits, distress or trauma.  Patient looks well and is asymptomatic here in the ED.  Unremarkable blood work.  CT head without ICH.  Due to patient reporting some weakness to her left hand where she says that she has dropped some objects, I am concerned with the possibility of stroke.  Patient is hesitant to be admitted for a stroke work-up/TIA work-up, we settled on a plan to perform MRI brain to assess for stroke and if negative follow-up with her PCP.  MRI returns without acute pathology or signs of CVA.  She continues to be symptomatic with a normal neurologic exam.  I again discussed with the possibility of TIA and offer an observation medical admission, but she declines.  She is a PCP visit in 6 days.  We discussed return precautions for the ED.  Patient medically stable for discharge home.   Clinical Course as of Feb 21 1643  Tue Feb 21, 2020  1553 Patient rolling back from MRI now   [DS]  1600 Reassessed.  Patient  reports that she feels well.  Awaiting MRI read.   [DS]  0034 Reassessed.  Educated patient on reassuring MRI.  We discussed the possibility of TIA, and she requests discharge with PCP follow-up, which I think is reasonable.  We discussed return precautions for the ED that would suggest worsening neurologic disease.   [DS]    Clinical Course User Index [DS] Vladimir Crofts, MD    ____________________________________________   FINAL CLINICAL IMPRESSION(S) / ED DIAGNOSES  Final diagnoses:  Paresthesias  Anxiety     ED Discharge Orders         Ordered    hydrOXYzine (ATARAX/VISTARIL) 25 MG tablet  Every 8 hours PRN        02/21/20 1637           Travion Ke   Note:  This document was prepared using Systems analyst and may include unintentional dictation errors.   Vladimir Crofts, MD 02/21/20 1650

## 2020-02-21 NOTE — ED Notes (Signed)
Pt calm , collective, denied pain or sob ,

## 2021-05-24 LAB — HM MAMMOGRAPHY

## 2021-07-16 ENCOUNTER — Ambulatory Visit
Admission: EM | Admit: 2021-07-16 | Discharge: 2021-07-16 | Disposition: A | Discharge status: Home / Self Care | Payer: Medicare HMO | Attending: Family Medicine | Admitting: Family Medicine

## 2021-07-16 DIAGNOSIS — R109 Unspecified abdominal pain: Secondary | ICD-10-CM | POA: Diagnosis not present

## 2021-07-16 DIAGNOSIS — R102 Pelvic and perineal pain: Secondary | ICD-10-CM | POA: Diagnosis not present

## 2021-07-16 DIAGNOSIS — R300 Dysuria: Secondary | ICD-10-CM

## 2021-07-16 LAB — POCT URINALYSIS DIP (MANUAL ENTRY)
Bilirubin, UA: NEGATIVE
Blood, UA: NEGATIVE
Glucose, UA: NEGATIVE mg/dL
Ketones, POC UA: NEGATIVE mg/dL
Leukocytes, UA: NEGATIVE
Nitrite, UA: NEGATIVE
Protein Ur, POC: NEGATIVE mg/dL
Spec Grav, UA: 1.025 (ref 1.010–1.025)
Urobilinogen, UA: 1 E.U./dL
pH, UA: 5.5 (ref 5.0–8.0)

## 2021-07-16 MED ORDER — CEPHALEXIN 500 MG PO CAPS: 500.0000 mg | ORAL_CAPSULE | Freq: Two times a day (BID) | ORAL | 0 refills | Status: DC

## 2021-07-16 MED ORDER — FLUCONAZOLE 150 MG PO TABS: 150.0000 mg | ORAL_TABLET | Freq: Every day | ORAL | 0 refills | Status: DC

## 2021-07-16 NOTE — ED Triage Notes (Signed)
Pt states that for a little over a week she has been having pain in her left abdomin radiating into the lower left back  ? ?Pt states she has been running a fever and she feels weak ? ?Pt states she is having urinary urgency ?

## 2021-07-16 NOTE — ED Provider Notes (Signed)
?[facility] ? ? ? ?CSN: [id number] ?Arrival date & time: 07/16/21  1542 ? ? ?  ? ?History   ?Chief Complaint ?Chief Complaint  ?Patient presents with  ? Abdominal Pain  ?  And back - Entered by patient  ? ? ?HPI ?Shelley White is a 66 y.o. female.  ? ?Presenting today with over a week of waxing and waning left lower abdominal pain radiating to the left flank, urinary urgency, dysuria, chills.  Denies nausea, vomiting, hematuria, chest pain, shortness of breath, upper respiratory symptoms.  Trying Azo with mild temporary relief of symptoms.  She states she is had urinary tract infections in the past that have felt similar. ? ? ?Past Medical History:  ?Diagnosis Date  ? Anxiety   ? Arthritis   ? left arm  ? COVID-19 10/2018  ? Depression   ? GERD (gastroesophageal reflux disease)   ? ? ?Patient Active Problem List  ? Diagnosis Date Noted  ? Encounter for screening colonoscopy   ? Polyp of colon   ? Hematuria 01/03/2019  ? Chest pain 12/08/2017  ? ? ?Past Surgical History:  ?Procedure Laterality Date  ? ABDOMINAL HYSTERECTOMY    ? BREAST BIOPSY Right   ? ADH  ? BREAST LUMPECTOMY Right 2007  ? ADH  ? COLONOSCOPY WITH PROPOFOL N/A 11/18/2019  ? Procedure: COLONOSCOPY WITH PROPOFOL;  Surgeon: Virgel Manifold, MD;  Location: [facility];  Service: Endoscopy;  Laterality: N/A;  ? CYSTOSCOPY WITH BIOPSY N/A 07/08/2019  ? Procedure: CYSTOSCOPY WITH  BIOPSY;  Surgeon: Billey Co, MD;  Location: [facility];  Service: Urology;  Laterality: N/A;  ? TONSILLECTOMY    ? age 76  ? URETEROSCOPY Bilateral 07/08/2019  ? Procedure: DIAGNOSTIC URETEROSCOPY;  Surgeon: Billey Co, MD;  Location: [facility];  Service: Urology;  Laterality: Bilateral;  ? ? ?OB History   ? ? Gravida  ?1  ? Para  ?1  ? Term  ?1  ? Preterm  ?   ? AB  ?   ? Living  ?1  ?  ? ? SAB  ?   ? IAB  ?   ? Ectopic  ?   ? Multiple  ?   ? Live Births  ?   ?   ?  ?  ? ? ? ?Home Medications   ? ?Prior to Admission medications   ?Medication Sig Start  Date End Date Taking? Authorizing Provider  ?cephALEXin (KEFLEX) 500 MG capsule Take 1 capsule (500 mg total) by mouth 2 (two) times daily. 07/16/21  Yes Volney American, PA-C  ?fluconazole (DIFLUCAN) 150 MG tablet Take 1 tablet (150 mg total) by mouth daily. 07/16/21  Yes Volney American, PA-C  ?aspirin EC 81 MG tablet Take 81 mg by mouth daily. Swallow whole.    [provider]  ?azithromycin (ZITHROMAX) 250 MG tablet Take by mouth. ?Patient not taking: Reported on 02/20/2020 10/07/19   [provider]  ?buPROPion (WELLBUTRIN XL) 150 MG 24 hr tablet Take 150 mg by mouth in the morning and at bedtime.     [provider]  ?cetirizine (ZYRTEC) 10 MG tablet Take 10 mg by mouth at bedtime.     [provider]  ?hydrOXYzine (ATARAX/VISTARIL) 25 MG tablet Take 25 mg by mouth at bedtime.  05/04/19   [provider]  ?hydrOXYzine (ATARAX/VISTARIL) 25 MG tablet Take 1 tablet (25 mg total) by mouth every 8 (eight) hours as needed for anxiety. 02/21/20  Vladimir Crofts, MD  ?Omega-3 Fatty Acids (OMEGA 3 500) 500 MG CAPS Take 500 mg by mouth daily.    [provider]  ?SUPREP BOWEL PREP KIT 17.5-3.13-1.6 GM/177ML SOLN Take by mouth as directed. ?Patient not taking: Reported on 02/20/2020 10/25/19   [provider]  ?zinc gluconate 50 MG tablet Take 50 mg by mouth daily.    [provider]  ? ? ?Family History ?Family History  ?Problem Relation Age of Onset  ? Breast cancer Neg Hx   ? ? ?Social History ?Social History  ? ?Tobacco Use  ? Smoking status: Every Day  ?  Packs/day: 1.00  ?  Years: 30.00  ?  Pack years: 30.00  ?  Types: Cigarettes  ? Smokeless tobacco: Never  ?Vaping Use  ? Vaping Use: Never used  ?Substance Use Topics  ? Alcohol use: No  ? Drug use: No  ? ? ? ?Allergies   ?Codeine ? ? ?Review of Systems ?Review of Systems ?Per HPI ? ?Physical Exam ?Triage Vital Signs ?ED Triage Vitals  ?Enc Vitals Group  ?   BP 07/16/21 1703 (!) 138/91  ?    Pulse Rate 07/16/21 1703 96  ?   Resp 07/16/21 1703 18  ?   Temp 07/16/21 1703 98.3 ?F (36.8 ?C)  ?   Temp Source 07/16/21 1703 Oral  ?   SpO2 07/16/21 1703 97 %  ?   Weight --   ?   Height --   ?   Head Circumference --   ?   Peak Flow --   ?   Pain Score 07/16/21 1659 4  ?   Pain Loc --   ?   Pain Edu? --   ?   Excl. in [place]? --   ? ?No data found. ? ?Updated Vital Signs ?BP (!) 138/91 (BP Location: Right Arm)   Pulse 96   Temp 98.3 ?F (36.8 ?C) (Oral)   Resp 18   SpO2 97%  ? ?Visual Acuity ?Right Eye Distance:   ?Left Eye Distance:   ?Bilateral Distance:   ? ?Right Eye Near:   ?Left Eye Near:    ?Bilateral Near:    ? ?Physical Exam ?Vitals and nursing note reviewed.  ?Constitutional:   ?   Appearance: Normal appearance. She is not ill-appearing.  ?HENT:  ?   Head: Atraumatic.  ?   Mouth/Throat:  ?   Mouth: Mucous membranes are moist.  ?Eyes:  ?   Extraocular Movements: Extraocular movements intact.  ?   Conjunctiva/sclera: Conjunctivae normal.  ?Cardiovascular:  ?   Rate and Rhythm: Normal rate and regular rhythm.  ?   Heart sounds: Normal heart sounds.  ?Pulmonary:  ?   Effort: Pulmonary effort is normal.  ?   Breath sounds: Normal breath sounds.  ?Abdominal:  ?   General: Bowel sounds are normal. There is no distension.  ?   Palpations: Abdomen is soft.  ?   Tenderness: There is no abdominal tenderness. There is no right CVA tenderness, left CVA tenderness or guarding.  ?Musculoskeletal:     ?   General: Normal range of motion.  ?   Cervical back: Normal range of motion and neck supple.  ?Skin: ?   General: Skin is warm and dry.  ?Neurological:  ?   Mental Status: She is alert and oriented to person, place, and time.  ?Psychiatric:     ?   Mood and Affect: Mood normal.     ?  Thought Content: Thought content normal.     ?   Judgment: Judgment normal.  ? ? ? ?UC Treatments / Results  ?Labs ?(all labs ordered are listed, but only abnormal results are displayed) ?Labs Reviewed  ?POCT URINALYSIS DIP (MANUAL  ENTRY)  ? ? ?EKG ? ? ?Radiology ?No results found. ? ?Procedures ?Procedures (including critical care time) ? ?Medications Ordered in UC ?Medications - No data to display ? ?Initial Impression / Assessment and Plan / UC Course  ?I have reviewed the triage vital signs and the nursing notes. ? ?Pertinent labs & imaging results that were available during my care of the patient were reviewed by me and considered in my medical decision making (see chart for details). ? ?  ? ?Exam and vital signs benign and reassuring today, urinalysis negative for urinary tract infection however given symptoms consistent with her past urinary tract infections, will start Keflex and monitor closely for improvement.  Diflucan sent in case antibiotics give yeast infection per her request.  Discussed supportive care and strict return precautions for any worsening or unresolving symptoms. ? ?Final Clinical Impressions(s) / UC Diagnoses  ? ?Final diagnoses:  ?Dysuria  ?Suprapubic pain  ?Left flank pain  ? ?Discharge Instructions   ?None ?  ? ?ED Prescriptions   ? ? Medication Sig Dispense Auth. Provider  ? cephALEXin (KEFLEX) 500 MG capsule Take 1 capsule (500 mg total) by mouth 2 (two) times daily. 10 capsule Volney American, [place]  ? fluconazole (DIFLUCAN) 150 MG tablet Take 1 tablet (150 mg total) by mouth daily. 1 tablet Volney American, [place]  ? ?  ? ?PDMP not reviewed this encounter. ?  ?Volney American, PA-C ?07/16/21 1816 ? ?

## 2022-02-26 LAB — BASIC METABOLIC PANEL
BUN: 11 (ref 4–21)
CO2: 22 (ref 13–22)
Chloride: 103 (ref 99–108)
Creatinine: 0.9 (ref 0.5–1.1)
Glucose: 87
Potassium: 4 mEq/L (ref 3.5–5.1)
Sodium: 143 (ref 137–147)

## 2022-02-26 LAB — CBC AND DIFFERENTIAL
HCT: 40 (ref 36–46)
Hemoglobin: 13.2 (ref 12.0–16.0)
Neutrophils Absolute: 3.6
Platelets: 280 10*3/uL (ref 150–400)
WBC: 6.4

## 2022-02-26 LAB — CBC: RBC: 4.6 (ref 3.87–5.11)

## 2022-02-26 LAB — COMPREHENSIVE METABOLIC PANEL
Albumin: 4.6 (ref 3.5–5.0)
Calcium: 9.3 (ref 8.7–10.7)
Globulin: 2.5
eGFR: 75

## 2022-02-26 LAB — HEPATIC FUNCTION PANEL
ALT: 13 U/L (ref 7–35)
AST: 16 (ref 13–35)
Alkaline Phosphatase: 93 (ref 25–125)
Bilirubin, Total: 0.4

## 2022-07-02 ENCOUNTER — Inpatient Hospital Stay: Admission: RE | Admit: 2022-07-02 | Payer: No Typology Code available for payment source | Source: Ambulatory Visit

## 2022-07-02 ENCOUNTER — Ambulatory Visit: Payer: Self-pay

## 2022-07-02 NOTE — Telephone Encounter (Signed)
  Chief Complaint: abdominal pain Symptoms: R sided abdominal pain radiates into back, pain 4/10, constant but gets worse at times Frequency: 3-4 weeks  Pertinent Negatives: Patient denies vomiting or diarrhea Disposition: [] ED /[] Urgent Care (no appt availability in office) / [] Appointment(In office/virtual)/ []  [facility]/ [] Home Care/ [] Refused Recommended Disposition /[] [facility]/ []  Follow-up with PCP Additional Notes: pt is scheduled for NPA at [facility] on 07/10/22, scheduled UC for today at 1345 and called and asked if they had XR available to, was informed by receptionist that they do have XR staff present today. Pt provided with location details. No further assistance needed.   Reason for Disposition  [1] MODERATE pain (e.g., interferes with normal activities) AND [2] pain comes and goes (cramps) AND [3] present > 24 hours  (Exception: Pain with Vomiting or Diarrhea - see that Guideline.)  Answer Assessment - Initial Assessment Questions 1. LOCATION: "Where does it hurt?"      R side but whole stomach hurts  2. RADIATION: "Does the pain shoot anywhere else?" (e.g., chest, back)     Around into back  3. ONSET: "When did the pain begin?" (e.g., minutes, hours or days ago)      3-4 weeks  5. PATTERN "Does the pain come and go, or is it constant?"    - If it comes and goes: "How long does it last?" "Do you have pain now?"     (Note: Comes and goes means the pain is intermittent. It goes away completely between bouts.)    - If constant: "Is it getting better, staying the same, or getting worse?"      (Note: Constant means the pain never goes away completely; most serious pain is constant and gets worse.)      Constant  6. SEVERITY: "How bad is the pain?"  (e.g., Scale 1-10; mild, moderate, or severe)    - MILD (1-3): Doesn't interfere with normal activities, abdomen soft and not tender to touch.     - MODERATE (4-7): Interferes with normal activities or awakens from  sleep, abdomen tender to touch.     - SEVERE (8-10): Excruciating pain, doubled over, unable to do any normal activities.       4/10 9. RELIEVING/AGGRAVATING FACTORS: "What makes it better or worse?" (e.g., antacids, bending or twisting motion, bowel movement)     BM makes better sometimes LBM this morning  10. OTHER SYMPTOMS: "Do you have any other symptoms?" (e.g., back pain, diarrhea, fever, urination pain, vomiting)       Nausea  Protocols used: Abdominal Pain - Female-A-AH

## 2022-07-10 ENCOUNTER — Ambulatory Visit (INDEPENDENT_AMBULATORY_CARE_PROVIDER_SITE_OTHER): Payer: No Typology Code available for payment source | Admitting: Physician Assistant

## 2022-07-10 ENCOUNTER — Encounter: Payer: Self-pay | Admitting: Physician Assistant

## 2022-07-10 VITALS — BP 130/70 | HR 68 | Temp 97.7°F | Ht 66.0 in | Wt 161.0 lb

## 2022-07-10 DIAGNOSIS — R350 Frequency of micturition: Secondary | ICD-10-CM

## 2022-07-10 DIAGNOSIS — R109 Unspecified abdominal pain: Secondary | ICD-10-CM | POA: Diagnosis not present

## 2022-07-10 DIAGNOSIS — Z87891 Personal history of nicotine dependence: Secondary | ICD-10-CM | POA: Diagnosis not present

## 2022-07-10 DIAGNOSIS — F419 Anxiety disorder, unspecified: Secondary | ICD-10-CM | POA: Diagnosis not present

## 2022-07-10 DIAGNOSIS — K529 Noninfective gastroenteritis and colitis, unspecified: Secondary | ICD-10-CM | POA: Diagnosis not present

## 2022-07-10 DIAGNOSIS — R002 Palpitations: Secondary | ICD-10-CM | POA: Diagnosis not present

## 2022-07-10 DIAGNOSIS — K5790 Diverticulosis of intestine, part unspecified, without perforation or abscess without bleeding: Secondary | ICD-10-CM | POA: Insufficient documentation

## 2022-07-10 LAB — POCT URINALYSIS DIPSTICK
Bilirubin, UA: NEGATIVE
Blood, UA: NEGATIVE
Glucose, UA: NEGATIVE
Ketones, UA: NEGATIVE
Leukocytes, UA: NEGATIVE
Nitrite, UA: NEGATIVE
Protein, UA: NEGATIVE
Spec Grav, UA: 1.015 (ref 1.010–1.025)
Urobilinogen, UA: 0.2 E.U./dL
pH, UA: 6 (ref 5.0–8.0)

## 2022-07-10 NOTE — Patient Instructions (Addendum)
-   I think you had a stomach bug that is just about gone - Your EKG today was normal and your heart sounds great, but please continue to monitor at home and keep a journal for when you feel skipped beats or fast heart rate.  - Congratulations on quitting smoking - keep up the good work - Come back in 2wk to touch base with me

## 2022-07-10 NOTE — Progress Notes (Signed)
Date:  07/10/2022   Name:  Shelley White   DOB:  1956/02/08   MRN:  [id number]   Chief Complaint: Establish Care and Abdominal Pain  HPI Shelley White is a pleasant 67 year old female with a history of diverticulosis, GERD, and self-reported anxiety who presents new to the practice today for evaluation of lower abdominal pain for the last 2 weeks.  She tells me initially, she had severe pain associated with diarrhea, nausea, and significantly increased flatus which lasted 1 week.  At the time of our exam today, most of her symptoms have resolved although she does still report mild tenderness in the right lower quadrant at times.  She is in no pain at rest.  No close contacts with similar symptoms, no questionable food ingestion to her knowledge.  Probably unrelated, she tells me she experienced resting tachycardia about a month ago with heart rate 145 bpm according to her smart watch, with associated fatigue. Sometimes she feels palpitations/skipped beats, no known history of arrhythmias.  Denies chest pain/SOB.  Reviewed her last EKG from November 2021 which was normal.  Reports urinary frequency and urgency last few weeks without dysuria.  Previously she has presented to the ED with complaint of abdominal pain which ended up being UTI.  Used to see [facility] for primary care but that practice no longer takes her insurance.  Recent Labs     Component Value Date/Time   NA 138 02/21/2020 1101   NA 142 07/10/2012 1058   K 3.5 02/21/2020 1101   K 3.6 07/10/2012 1058   CL 105 02/21/2020 1101   CL 111 (H) 07/10/2012 1058   CO2 23 02/21/2020 1101   CO2 27 07/10/2012 1058   GLUCOSE 114 (H) 02/21/2020 1101   GLUCOSE 94 07/10/2012 1058   BUN 11 02/21/2020 1101   BUN 8 07/10/2012 1058   CREATININE 0.82 02/21/2020 1101   CREATININE 0.63 07/10/2012 1058   CALCIUM 8.6 (L) 02/21/2020 1101   CALCIUM 8.7 07/10/2012 1058   PROT 7.3 02/21/2020 1101   ALBUMIN 4.2 02/21/2020 1101   AST 21  02/21/2020 1101   ALT 18 02/21/2020 1101   ALKPHOS 80 02/21/2020 1101   BILITOT 0.8 02/21/2020 1101   GFRNONAA >60 02/21/2020 1101   GFRNONAA >60 07/10/2012 1058   GFRAA >60 06/21/2019 1657   GFRAA >60 07/10/2012 1058    Lab Results  Component Value Date   WBC 5.7 02/21/2020   HGB 12.7 02/21/2020   HCT 37.3 02/21/2020   MCV 84.0 02/21/2020   PLT 265 02/21/2020   No results found for: "HGBA1C" No results found for: "CHOL", "HDL", "LDLCALC", "LDLDIRECT", "TRIG", "CHOLHDL" No results found for: "TSH"  Review of Systems  Constitutional:  Negative for fever.  Respiratory:  Negative for shortness of breath.   Cardiovascular:  Positive for palpitations. Negative for chest pain.  Gastrointestinal:  Positive for abdominal pain, constipation, diarrhea and nausea.  Genitourinary:  Positive for frequency and urgency. Negative for dysuria.    Patient Active Problem List   Diagnosis Date Noted   Diverticulosis 07/10/2022   Former smoker 07/10/2022   Mild anxiety 07/10/2022   Palpitations 07/10/2022   Encounter for screening colonoscopy    Polyp of colon    Hematuria 01/03/2019   Chest pain 12/08/2017    Allergies  Allergen Reactions   Codeine Itching and Swelling    Past Surgical History:  Procedure Laterality Date   ABDOMINAL HYSTERECTOMY     BREAST BIOPSY Right  ADH   BREAST LUMPECTOMY Right 2007   ADH   COLONOSCOPY WITH PROPOFOL N/A 11/18/2019   Procedure: COLONOSCOPY WITH PROPOFOL;  Surgeon: Virgel Manifold, MD;  Location: [facility];  Service: Endoscopy;  Laterality: N/A;   CYSTOSCOPY WITH BIOPSY N/A 07/08/2019   Procedure: CYSTOSCOPY WITH  BIOPSY;  Surgeon: Billey Co, MD;  Location: [facility];  Service: Urology;  Laterality: N/A;   TONSILLECTOMY     age 29   URETEROSCOPY Bilateral 07/08/2019   Procedure: DIAGNOSTIC URETEROSCOPY;  Surgeon: Billey Co, MD;  Location: [facility];  Service: Urology;  Laterality: Bilateral;    Social History    Tobacco Use   Smoking status: Former    Packs/day: 1.00    Years: 30.00    Additional pack years: 0.00    Total pack years: 30.00    Types: Cigarettes    Quit date: 06/13/2022    Years since quitting: 0.0   Smokeless tobacco: Never  Vaping Use   Vaping Use: Never used  Substance Use Topics   Alcohol use: No   Drug use: No     Medication list has been reviewed and updated.  Current Meds  Medication Sig   aspirin EC 81 MG tablet Take 81 mg by mouth daily. Swallow whole.   buPROPion (WELLBUTRIN XL) 150 MG 24 hr tablet Take 150 mg by mouth in the morning and at bedtime.    cetirizine (ZYRTEC) 10 MG tablet Take 10 mg by mouth at bedtime.    hydrOXYzine (ATARAX/VISTARIL) 25 MG tablet Take 1 tablet (25 mg total) by mouth every 8 (eight) hours as needed for anxiety.   Omega-3 Fatty Acids (OMEGA 3 500) 500 MG CAPS Take 500 mg by mouth daily.   omeprazole (PRILOSEC) 40 MG capsule Take 40 mg by mouth daily.   venlafaxine XR (EFFEXOR-XR) 37.5 MG 24 hr capsule Take 37.5 mg by mouth daily.   zinc gluconate 50 MG tablet Take 50 mg by mouth daily.   Current Facility-Administered Medications for the 07/10/22 encounter (Office Visit) with Jeannene Patella, PA  Medication   betamethasone acetate-betamethasone sodium phosphate (CELESTONE) injection 3 mg       07/10/2022    3:21 PM  GAD 7 : Generalized Anxiety Score  Nervous, Anxious, on Edge 1  Control/stop worrying 1  Worry too much - different things 1  Trouble relaxing 2  Restless 2  Easily annoyed or irritable 1  Afraid - awful might happen 0  Total GAD 7 Score 8  Anxiety Difficulty Somewhat difficult       07/10/2022    3:21 PM  Depression screen PHQ 2/9  Decreased Interest 1  Down, Depressed, Hopeless 1  PHQ - 2 Score 2  Altered sleeping 1  Tired, decreased energy 3  Change in appetite 0  Feeling bad or failure about yourself  0  Trouble concentrating 0  Moving slowly or fidgety/restless 0  Suicidal thoughts 0   PHQ-9 Score 6  Difficult doing work/chores Somewhat difficult    BP Readings from Last 3 Encounters:  07/10/22 130/70  07/16/21 (!) 138/91  02/21/20 (!) 141/87    Physical Exam Vitals and nursing note reviewed.  Constitutional:      Appearance: Normal appearance.  Cardiovascular:     Rate and Rhythm: Normal rate and regular rhythm.     Pulses:          Carotid pulses are 2+ on the right side and 2+ on the left side.  Heart sounds: Normal heart sounds. No murmur heard.    No friction rub. No gallop.     Comments: No carotid bruit Pulmonary:     Effort: Pulmonary effort is normal.     Breath sounds: Normal breath sounds.  Abdominal:     Tenderness: There is abdominal tenderness in the right lower quadrant. There is right CVA tenderness. There is no left CVA tenderness.     Comments: Mild tenderness with deep palpation of the RLQ/pelvic area.  Positive CVA bump test on right     Wt Readings from Last 3 Encounters:  07/10/22 161 lb (73 kg)  02/21/20 160 lb 0.9 oz (72.6 kg)  02/20/20 160 lb (72.6 kg)    BP 130/70   Pulse 68   Temp 97.7 F (36.5 C) (Oral)   Ht 5\' 6"  (1.676 m)   Wt 161 lb (73 kg)   SpO2 97%   BMI 25.99 kg/m   Assessment and Plan:  1. Acute gastroenteritis Patient reassured based on her history provided today, I suspect viral gastroenteritis which has largely resolved.  Continue adequate hydration and nutrition.  2. Acute right flank pain CVA tender on exam today.  Urinalysis clear.  Continue to monitor for changes - POCT urinalysis dipstick  3. Urinary frequency Plan as above - POCT urinalysis dipstick  4. Palpitations Patient reassured today's EKG is normal with no changes from previous EKG November 2021.  Likely this is anxiety related.  However, I described that arrhythmias can sometimes come and go, and I would like for her to keep a journal and write down any time she feels palpitations/tachycardia.  Consider ZIO in the future if this is  a recurrent problem - EKG 12-Lead  5. Mild anxiety Mild score on GAD-7 today of 8 point.  Some of this may be related to recent illness and recent smoking cessation.  Monitor for now, no medications at this time.  6. Former smoker Congratulated on smoking cessation and encouraged continued abstinence from tobacco.  Probably due for LDCT next visit   Return in about 2 weeks (around 07/24/2022) for nonfasting CPE and f/u abd pain, palpitations.   Partially dictated using Editor, commissioning. Any errors are unintentional.  Lupita Leash, PA-C, DMSc, [place] [phone]

## 2022-07-30 ENCOUNTER — Encounter: Payer: No Typology Code available for payment source | Admitting: Physician Assistant

## 2022-08-08 ENCOUNTER — Ambulatory Visit (INDEPENDENT_AMBULATORY_CARE_PROVIDER_SITE_OTHER): Payer: No Typology Code available for payment source

## 2022-08-08 ENCOUNTER — Ambulatory Visit: Payer: No Typology Code available for payment source | Attending: Physician Assistant

## 2022-08-08 ENCOUNTER — Ambulatory Visit (INDEPENDENT_AMBULATORY_CARE_PROVIDER_SITE_OTHER): Payer: No Typology Code available for payment source | Admitting: Physician Assistant

## 2022-08-08 ENCOUNTER — Encounter: Payer: Self-pay | Admitting: Physician Assistant

## 2022-08-08 VITALS — BP 122/76 | HR 86 | Temp 98.2°F | Ht 66.0 in | Wt 164.0 lb

## 2022-08-08 VITALS — BP 122/76 | HR 86 | Ht 66.0 in | Wt 164.0 lb

## 2022-08-08 DIAGNOSIS — Z122 Encounter for screening for malignant neoplasm of respiratory organs: Secondary | ICD-10-CM | POA: Diagnosis not present

## 2022-08-08 DIAGNOSIS — Z1159 Encounter for screening for other viral diseases: Secondary | ICD-10-CM | POA: Diagnosis not present

## 2022-08-08 DIAGNOSIS — K219 Gastro-esophageal reflux disease without esophagitis: Secondary | ICD-10-CM | POA: Diagnosis not present

## 2022-08-08 DIAGNOSIS — F419 Anxiety disorder, unspecified: Secondary | ICD-10-CM

## 2022-08-08 DIAGNOSIS — Z23 Encounter for immunization: Secondary | ICD-10-CM

## 2022-08-08 DIAGNOSIS — M8580 Other specified disorders of bone density and structure, unspecified site: Secondary | ICD-10-CM | POA: Diagnosis not present

## 2022-08-08 DIAGNOSIS — I471 Supraventricular tachycardia, unspecified: Secondary | ICD-10-CM | POA: Diagnosis not present

## 2022-08-08 DIAGNOSIS — Z13220 Encounter for screening for lipoid disorders: Secondary | ICD-10-CM

## 2022-08-08 DIAGNOSIS — Z13820 Encounter for screening for osteoporosis: Secondary | ICD-10-CM

## 2022-08-08 DIAGNOSIS — E7800 Pure hypercholesterolemia, unspecified: Secondary | ICD-10-CM | POA: Diagnosis not present

## 2022-08-08 DIAGNOSIS — Z87891 Personal history of nicotine dependence: Secondary | ICD-10-CM | POA: Diagnosis not present

## 2022-08-08 DIAGNOSIS — Z0000 Encounter for general adult medical examination without abnormal findings: Secondary | ICD-10-CM

## 2022-08-08 DIAGNOSIS — Z1629 Resistance to other single specified antibiotic: Secondary | ICD-10-CM | POA: Diagnosis not present

## 2022-08-08 DIAGNOSIS — Z8639 Personal history of other endocrine, nutritional and metabolic disease: Secondary | ICD-10-CM | POA: Diagnosis not present

## 2022-08-08 DIAGNOSIS — R002 Palpitations: Secondary | ICD-10-CM

## 2022-08-08 DIAGNOSIS — Z1321 Encounter for screening for nutritional disorder: Secondary | ICD-10-CM

## 2022-08-08 DIAGNOSIS — R5383 Other fatigue: Secondary | ICD-10-CM | POA: Diagnosis not present

## 2022-08-08 DIAGNOSIS — Z1329 Encounter for screening for other suspected endocrine disorder: Secondary | ICD-10-CM | POA: Diagnosis not present

## 2022-08-08 DIAGNOSIS — R7309 Other abnormal glucose: Secondary | ICD-10-CM | POA: Diagnosis not present

## 2022-08-08 DIAGNOSIS — Z780 Asymptomatic menopausal state: Secondary | ICD-10-CM | POA: Diagnosis not present

## 2022-08-08 DIAGNOSIS — Z1231 Encounter for screening mammogram for malignant neoplasm of breast: Secondary | ICD-10-CM

## 2022-08-08 MED ORDER — VENLAFAXINE HCL ER 37.5 MG PO CP24: 37.5000 mg | ORAL_CAPSULE | Freq: Every day | ORAL | 3 refills | Status: DC

## 2022-08-08 MED ORDER — OMEPRAZOLE 40 MG PO CPDR: 40.0000 mg | DELAYED_RELEASE_CAPSULE | Freq: Every day | ORAL | 3 refills | Status: DC

## 2022-08-08 MED ORDER — HYDROXYZINE HCL 25 MG PO TABS: 25.0000 mg | ORAL_TABLET | Freq: Three times a day (TID) | ORAL | 3 refills | Status: DC | PRN

## 2022-08-08 MED ORDER — BUPROPION HCL ER (SR) 150 MG PO TB12: 150.0000 mg | ORAL_TABLET | Freq: Two times a day (BID) | ORAL | 3 refills | Status: DC

## 2022-08-08 NOTE — Progress Notes (Signed)
Date:  08/08/2022   Name:  ROTUNDA WORDEN   DOB:  Sep 11, 1955   MRN:  [id number]   Chief Complaint: Annual Exam (Breast exam no pap, pt has bruise on stomach purple, doesn't know how it got there)  HPI  RAJANEE SCHUELKE is a 67 y.o. female who presents today for her Complete Annual Exam. She feels well. She reports exercising none. She reports she is sleeping well. Breast complaints tender breast, both breast.  Annual eye exam, but no routine dentistry; brushes once per day but does not floss.  Mammogram: 05/24/21 DEXA: 02/23/12 (osteopenia) Colonoscopy: 11/18/2019 (polyps, return 2026)  Takya returns for routine physical following last visit with me 07/10/2022 for gastroenteritis which has now resolved.  Previous PCP Cheri Kearns - we have requested records and will request again today.  Patient is quite confident that she received her first Shingrix vaccine within the last 6 months; we will review the record but administer a shot today as well.  Last visit she brought up concern of intermittent resting tachycardia with associated fatigue and palpitations for about 2 months now, and she has brought several recordings from her Apple Watch for me to review today.  No known history of arrhythmia, EKG November 2021 normal.  At the time of exam today, she feels well aside from mentioning "I just do not have the energy I used to.  I was hoping quitting smoking would help".  She has now been almost 2 months without smoking.  As noted above, she does have a lump of the upper outer quadrant of right breast which is intermittently tender, she has mentioned this to prior providers and mammogram technicians, but previous mammograms have never showed concerning findings.  Also has an area of tenderness inferior to the left nipple without appreciable mass.  She has a small bruise of the abdomen, not sure how it got there.  Health Maintenance Due  Topic Date Due   Pneumonia Vaccine 45+ Years old (1 of 2  - PCV) Never done   Hepatitis C Screening  Never done   DTaP/Tdap/Td (1 - Tdap) Never done   Zoster Vaccines- Shingrix (1 of 2) Never done   Lung Cancer Screening  11/22/2019    Immunization History  Administered Date(s) Administered   PFIZER(Purple Top)SARS-COV-2 Vaccination 06/13/2019, 07/10/2019      Recent Labs     Component Value Date/Time   NA 138 02/21/2020 1101   NA 142 07/10/2012 1058   K 3.5 02/21/2020 1101   K 3.6 07/10/2012 1058   CL 105 02/21/2020 1101   CL 111 (H) 07/10/2012 1058   CO2 23 02/21/2020 1101   CO2 27 07/10/2012 1058   GLUCOSE 114 (H) 02/21/2020 1101   GLUCOSE 94 07/10/2012 1058   BUN 11 02/21/2020 1101   BUN 8 07/10/2012 1058   CREATININE 0.82 02/21/2020 1101   CREATININE 0.63 07/10/2012 1058   CALCIUM 8.6 (L) 02/21/2020 1101   CALCIUM 8.7 07/10/2012 1058   PROT 7.3 02/21/2020 1101   ALBUMIN 4.2 02/21/2020 1101   AST 21 02/21/2020 1101   ALT 18 02/21/2020 1101   ALKPHOS 80 02/21/2020 1101   BILITOT 0.8 02/21/2020 1101   GFRNONAA >60 02/21/2020 1101   GFRNONAA >60 07/10/2012 1058   GFRAA >60 06/21/2019 1657   GFRAA >60 07/10/2012 1058    Lab Results  Component Value Date   WBC 5.7 02/21/2020   HGB 12.7 02/21/2020   HCT 37.3 02/21/2020   MCV 84.0  02/21/2020   PLT 265 02/21/2020   No results found for: "HGBA1C" No results found for: "CHOL", "HDL", "LDLCALC", "LDLDIRECT", "TRIG", "CHOLHDL" No results found for: "TSH"  Review of Systems  Constitutional:  Positive for fatigue.  Respiratory:  Negative for shortness of breath and wheezing.   Cardiovascular:  Positive for palpitations. Negative for chest pain.    Patient Active Problem List   Diagnosis Date Noted   Gastroesophageal reflux disease 08/08/2022   History of vitamin D deficiency 08/08/2022   Osteopenia after menopause 08/08/2022   Diverticulosis 07/10/2022   Former smoker 07/10/2022   Mild anxiety 07/10/2022   Palpitations 07/10/2022   Encounter for screening  colonoscopy    Polyp of colon    Hematuria 01/03/2019   Chest pain 12/08/2017    Allergies  Allergen Reactions   Codeine Itching and Swelling    Past Surgical History:  Procedure Laterality Date   ABDOMINAL HYSTERECTOMY     BREAST BIOPSY Right    ADH   BREAST LUMPECTOMY Right 2007   ADH   COLONOSCOPY WITH PROPOFOL N/A 11/18/2019   Procedure: COLONOSCOPY WITH PROPOFOL;  Surgeon: Pasty Spillers, MD;  Location: [facility];  Service: Endoscopy;  Laterality: N/A;   CYSTOSCOPY WITH BIOPSY N/A 07/08/2019   Procedure: CYSTOSCOPY WITH  BIOPSY;  Surgeon: Sondra Come, MD;  Location: [facility] ORS;  Service: Urology;  Laterality: N/A;   TONSILLECTOMY     age 15   URETEROSCOPY Bilateral 07/08/2019   Procedure: DIAGNOSTIC URETEROSCOPY;  Surgeon: Sondra Come, MD;  Location: [facility] OR[facility];  Service: Urology;  Laterality: Bilateral;    Social History   Tobacco Use   Smoking status: Former    Packs/day: 1.00    Years: 30.00    Additional pack years: 0.00    Total pack years: 30.00    Types: Cigarettes    Quit date: 06/13/2022    Years since quitting: 0.1   Smokeless tobacco: Never  Vaping Use   Vaping Use: Never used  Substance Use Topics   Alcohol use: No   Drug use: No     Medication list has been reviewed and updated.  Current Meds  Medication Sig   aspirin EC 81 MG tablet Take 81 mg by mouth daily. Swallow whole.   buPROPion (WELLBUTRIN SR) 150 MG 12 hr tablet Take 1 tablet (150 mg total) by mouth 2 (two) times daily.   cetirizine (ZYRTEC) 10 MG tablet Take 10 mg by mouth at bedtime.    Omega-3 Fatty Acids (OMEGA 3 500) 500 MG CAPS Take 500 mg by mouth daily.   zinc gluconate 50 MG tablet Take 50 mg by mouth daily.   [DISCONTINUED] buPROPion (WELLBUTRIN XL) 150 MG 24 hr tablet Take 150 mg by mouth in the morning and at bedtime.    [DISCONTINUED] hydrOXYzine (ATARAX/VISTARIL) 25 MG tablet Take 1 tablet (25 mg total) by mouth every 8 (eight) hours as needed for anxiety.    [DISCONTINUED] omeprazole (PRILOSEC) 40 MG capsule Take 40 mg by mouth daily.   [DISCONTINUED] venlafaxine XR (EFFEXOR-XR) 37.5 MG 24 hr capsule Take 37.5 mg by mouth daily.       08/08/2022   10:50 AM 07/10/2022    3:21 PM  GAD 7 : Generalized Anxiety Score  Nervous, Anxious, on Edge 1 1  Control/stop worrying 1 1  Worry too much - different things 1 1  Trouble relaxing 1 2  Restless 1 2  Easily annoyed or irritable 1 1  Afraid - awful  might happen 1 0  Total GAD 7 Score 7 8  Anxiety Difficulty Not difficult at all Somewhat difficult       08/08/2022   10:59 AM 08/08/2022   10:50 AM 07/10/2022    3:21 PM  Depression screen PHQ 2/9  Decreased Interest 1 1 1   Down, Depressed, Hopeless 1 1 1   PHQ - 2 Score 2 2 2   Altered sleeping 1 1 1   Tired, decreased energy 0 0 3  Change in appetite 0 0 0  Feeling bad or failure about yourself  0 1 0  Trouble concentrating 1 1 0  Moving slowly or fidgety/restless 1  0  Suicidal thoughts 0 0 0  PHQ-9 Score 5 5 6   Difficult doing work/chores Somewhat difficult Not difficult at all Somewhat difficult    BP Readings from Last 3 Encounters:  08/08/22 122/76  07/10/22 130/70  07/16/21 (!) 138/91    Wt Readings from Last 3 Encounters:  08/08/22 164 lb (74.4 kg)  07/10/22 161 lb (73 kg)  02/21/20 160 lb 0.9 oz (72.6 kg)    BP 122/76   Pulse 86   Temp 98.2 F (36.8 C) (Oral)   Ht 5\' 6"  (1.676 m)   Wt 164 lb (74.4 kg)   SpO2 97%   BMI 26.47 kg/m   Physical Exam Vitals and nursing note reviewed. Exam conducted with a chaperone present.  Constitutional:      Appearance: Normal appearance. She is well-groomed.  HENT:     Ears:     Comments: EAC clear bilaterally with good view of TM which is without effusion or erythema.     Nose: Nose normal.     Mouth/Throat:     Mouth: Mucous membranes are moist. No oral lesions.     Dentition: Has dentures.     Pharynx: Uvula midline. No posterior oropharyngeal erythema.     Comments:  Upper dentures.  Lower teeth are stained, presumably with nicotine Eyes:     General: Vision grossly intact.     Extraocular Movements: Extraocular movements intact.     Conjunctiva/sclera: Conjunctivae normal.     Pupils: Pupils are equal, round, and reactive to light.  Neck:     Thyroid: No thyroid mass or thyromegaly.  Cardiovascular:     Rate and Rhythm: Normal rate and regular rhythm.     Heart sounds: S1 normal and S2 normal. No murmur heard.    No friction rub. No gallop.     Comments: Pulses 2+ at radial, PT, DP bilaterally. No carotid bruit. No peripheral edema Pulmonary:     Effort: Pulmonary effort is normal.     Breath sounds: Normal breath sounds.  Chest:       Comments: Breast exam typical for age. No obvious asymmetry or lymphadenopathy  There is a 3 cm somewhat soft, mobile, and tender lump of upper outer quadrant of RIGHT breast.   There is an area of tenderness just inferior to the left nipple without appreciable mass Abdominal:     General: Bowel sounds are normal.     Palpations: Abdomen is soft. There is no mass.     Tenderness: There is no abdominal tenderness.  Musculoskeletal:     Comments: Full ROM with strength 5/5 bilateral upper and lower extremities  Lymphadenopathy:     Cervical: No cervical adenopathy.  Skin:    General: Skin is warm.     Capillary Refill: Capillary refill takes less than 2 seconds.  Findings: No lesion or rash.     Comments: 4 cm x 2 cm bruise of right abdomen, mildly tender to deep palpation  Neurological:     Mental Status: She is alert and oriented to person, place, and time.     Cranial Nerves: Cranial nerves 2-12 are intact.     Gait: Gait is intact.  Psychiatric:        Mood and Affect: Mood and affect normal.        Behavior: Behavior normal.      Assessment and Plan:  1. Annual physical exam Essentially normal physical today.  Breast lump likely benign fibrodensity given history of normal mammograms; we  will follow-up with mammogram however.  Encouraged healthy lifestyle including regular physical activity and consumption of whole fruits and vegetables.  Check routine labs today  - CBC with Differential/Platelet - Comprehensive metabolic panel - TSH  2. Palpitations Review of the patient's recordings on the Apple Watch app show a rhythm strip which goes from normal sinus rhythm of about 80 bpm to what appears to be sinus tachycardia about 120 bpm, at rest, symptomatic.  Heart rate steady today on auscultation.  Will order Zio patch.  - LONG TERM MONITOR (3-14 DAYS)  3. Fatigue, unspecified type Check fatigue labs - CBC with Differential/Platelet - Comprehensive metabolic panel - TSH - B12 and Folate Panel - VITAMIN D 25 Hydroxy (Vit-D Deficiency, Fractures)  4. Mild anxiety Well-controlled with current regimen, continue Effexor and hydroxyzine, both of which are low doses.  Discussed hydroxyzine has potential to cause fatigue, patient will be mindful of this - hydrOXYzine (ATARAX) 25 MG tablet; Take 1 tablet (25 mg total) by mouth every 8 (eight) hours as needed for anxiety.  Dispense: 90 tablet; Refill: 3 - venlafaxine XR (EFFEXOR-XR) 37.5 MG 24 hr capsule; Take 1 capsule (37.5 mg total) by mouth daily.  Dispense: 90 capsule; Refill: 3  5. Gastroesophageal reflux disease, unspecified whether esophagitis present Refill omeprazole as below.  Will check for vitamin deficiencies from long-term PPI use - omeprazole (PRILOSEC) 40 MG capsule; Take 1 capsule (40 mg total) by mouth daily.  Dispense: 90 capsule; Refill: 3  6. 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  Due for LDCT, sending referral for this.  Refill bupropion as below which helps with cigarette cravings and depressive symptoms - Ambulatory Referral for Lung Cancer Screening [REF832] - buPROPion (WELLBUTRIN SR) 150 MG 12 hr tablet; Take 1 tablet (150 mg total) by mouth 2 (two)  times daily.  Dispense: 180 tablet; Refill: 3  7. Encounter for screening for lung cancer Sending for LDCT - Ambulatory Referral for Lung Cancer Screening [REF832]  8. Screening for hyperlipidemia Screen lipids today - Lipid panel  9. History of vitamin D deficiency Check for vitamin D deficiency as a potential cause for her fatigue  10. Encounter for vitamin deficiency screening Check vitamin D, B12, B9 to evaluate fatigue - B12 and Folate Panel - VITAMIN D 25 Hydroxy (Vit-D Deficiency, Fractures)  11. Encounter for screening mammogram for breast cancer Sending for screening mammogram.  Normal in February 2023 - MM 3D SCREENING MAMMOGRAM BILATERAL BREAST  12. Encounter for screening for osteoporosis Last DEXA over 10 years ago suggested osteopenia, will repeat DEXA - DG Bone Density  13. Encounter for hepatitis C screening test for low risk patient One-time screening for hepatitis C - Hepatitis C Antibody  14. Osteopenia after menopause Plan as above.  Repeat DEXA.  15. Need  for zoster immunization Shingrix administered today, probably dose 2 of 2 as patient seems quite certain she got the first at her previous PCP earlier this year. Will review records.   Patient does not like to visit the doctor very often and asks for once per year visits.  I am okay with this, but she knows that she may return for sooner visits as needed and we would prefer to see her more routinely especially if labs and screenings ordered today show abnormalities.  Return in about 1 year (around 08/08/2023) for fasting CPE.   AWV performed today by Laurian Brim, CMA  Partially dictated using Dragon software. Any errors are unintentional.  Alvester Morin, PA-C, DMSc, Nutritionist [facility] [phone]

## 2022-08-08 NOTE — Progress Notes (Addendum)
Subjective:   Shelley White is a 67 y.o. female who presents for Medicare Annual (Subsequent) preventive examination.  Review of Systems    Seen patient in office       Objective:    There were no vitals filed for this visit. There is no height or weight on file to calculate BMI.     08/08/2022    1:51 PM 02/21/2020   11:00 AM 02/20/2020    1:09 PM 11/18/2019    9:06 AM 07/05/2019   11:06 AM 06/21/2019    4:55 PM 04/28/2019    9:03 AM  Advanced Directives  Does Patient Have a Medical Advance Directive? No No No No No No Yes  Would patient like information on creating a medical advance directive?  No - Patient declined         Current Medications (verified) Outpatient Encounter Medications as of 08/08/2022  Medication Sig   aspirin EC 81 MG tablet Take 81 mg by mouth daily. Swallow whole.   buPROPion (WELLBUTRIN SR) 150 MG 12 hr tablet Take 1 tablet (150 mg total) by mouth 2 (two) times daily.   cetirizine (ZYRTEC) 10 MG tablet Take 10 mg by mouth at bedtime.    hydrOXYzine (ATARAX) 25 MG tablet Take 1 tablet (25 mg total) by mouth every 8 (eight) hours as needed for anxiety.   Omega-3 Fatty Acids (OMEGA 3 500) 500 MG CAPS Take 500 mg by mouth daily.   omeprazole (PRILOSEC) 40 MG capsule Take 1 capsule (40 mg total) by mouth daily.   venlafaxine XR (EFFEXOR-XR) 37.5 MG 24 hr capsule Take 1 capsule (37.5 mg total) by mouth daily.   zinc gluconate 50 MG tablet Take 50 mg by mouth daily.   No facility-administered encounter medications on file as of 08/08/2022.    Allergies (verified) Codeine   History: Past Medical History:  Diagnosis Date   Anxiety    Arthritis    left arm   COVID-19 10/2018   Depression    Diverticulosis    GERD (gastroesophageal reflux disease)    Past Surgical History:  Procedure Laterality Date   ABDOMINAL HYSTERECTOMY     BREAST BIOPSY Right    ADH   BREAST LUMPECTOMY Right 2007   ADH   COLONOSCOPY WITH PROPOFOL N/A 11/18/2019   Procedure:  COLONOSCOPY WITH PROPOFOL;  Surgeon: Pasty Spillers, MD;  Location: [facility];  Service: Endoscopy;  Laterality: N/A;   CYSTOSCOPY WITH BIOPSY N/A 07/08/2019   Procedure: CYSTOSCOPY WITH  BIOPSY;  Surgeon: Sondra Come, MD;  Location: [facility] OR[facility];  Service: Urology;  Laterality: N/A;   TONSILLECTOMY     age 84   URETEROSCOPY Bilateral 07/08/2019   Procedure: DIAGNOSTIC URETEROSCOPY;  Surgeon: Sondra Come, MD;  Location: [facility] OR[facility];  Service: Urology;  Laterality: Bilateral;   Family History  Problem Relation Age of Onset   Stroke Mother    Hypertension Mother    Diabetes Mother    Cancer Father    Colon cancer Father    Breast cancer Neg Hx    Social History   Socioeconomic History   Marital status: Married    Spouse name: Not on file   Number of children: 1   Years of education: Not on file   Highest education level: Not on file  Occupational History   Not on file  Tobacco Use   Smoking status: Former    Packs/day: 1.00    Years: 30.00    Additional pack  years: 0.00    Total pack years: 30.00    Types: Cigarettes    Quit date: 06/13/2022    Years since quitting: 0.1   Smokeless tobacco: Never  Vaping Use   Vaping Use: Never used  Substance and Sexual Activity   Alcohol use: No   Drug use: No   Sexual activity: Yes    Birth control/protection: Post-menopausal  Other Topics Concern   Not on file  Social History Narrative   Not on file   Social Determinants of Health   Financial Resource Strain: Low Risk  (08/08/2022)   Overall Financial Resource Strain (CARDIA)    Difficulty of Paying Living Expenses: Not hard at all  Food Insecurity: No Food Insecurity (08/08/2022)   Hunger Vital Sign    Worried About Running Out of Food in the Last Year: Never true    Ran Out of Food in the Last Year: Never true  Transportation Needs: No Transportation Needs (08/08/2022)   PRAPARE - Administrator, Civil Service (Medical): No    Lack of Transportation  (Non-Medical): No  Physical Activity: Not on file  Stress: Not on file  Social Connections: Not on file    Tobacco Counseling Counseling given: Not Answered   Clinical Intake:  Pre-visit preparation completed: Yes  Pain : No/denies pain     Diabetes: Yes CBG done?: No     Diabetic?no  Interpreter Needed?: No      Activities of Daily Living    08/08/2022    1:52 PM  In your present state of health, do you have any difficulty performing the following activities:  Hearing? 0  Vision? 0  Difficulty concentrating or making decisions? 0  Walking or climbing stairs? 0  Dressing or bathing? 0  Preparing Food and eating ? N  Using the Toilet? N  In the past six months, have you accidently leaked urine? N  Do you have problems with loss of bowel control? N  Managing your Medications? N  Managing your Finances? N  Housekeeping or managing your Housekeeping? N    Patient Care Team: Remo Lipps, PA as PCP - General (Physician Assistant)  Indicate any recent Medical Services you may have received from other than [facility] providers in the past year (date may be approximate).     Assessment:   This is a routine wellness examination for [facility].  Hearing/Vision screen Hearing Screening - Comments:: No hearing problems  Dietary issues and exercise activities discussed: Current Exercise Habits: The patient does not participate in regular exercise at present   Goals Addressed   None   Depression Screen    08/08/2022   10:59 AM 08/08/2022   10:50 AM 07/10/2022    3:21 PM  PHQ 2/9 Scores  PHQ - 2 Score 2 2 2   PHQ- 9 Score 5 5 6     Fall Risk    08/08/2022    1:52 PM 07/10/2022    3:22 PM 11/30/2019    1:48 PM  Fall Risk   Falls in the past year? 0 0   Number falls in past yr: 0 0 0  Injury with Fall? 0 0 0  Risk for fall due to : No Fall Risks No Fall Risks   Follow up Falls evaluation completed Falls evaluation completed     FALL RISK PREVENTION  PERTAINING TO THE HOME:  Any stairs in or around the home? No  If so, are there any without handrails? No  Home free  of loose throw rugs in walkways, pet beds, electrical cords, etc? Yes  Adequate lighting in your home to reduce risk of falls? Yes   ASSISTIVE DEVICES UTILIZED TO PREVENT FALLS:  Life alert? No  Use of a cane, walker or w/c? No  Grab bars in the bathroom? No  Shower chair or bench in shower? No  Elevated toilet seat or a handicapped toilet? No   TIMED UP AND GO:  Was the test performed? No .  Length of time to ambulate 10 feet: n/a sec.   Gait steady and fast without use of assistive device  Cognitive Function:        08/08/2022    1:52 PM  6CIT Screen  What Year? 0 points  What month? 0 points  What time? 0 points  Count back from 20 0 points  Months in reverse 0 points  Repeat phrase 4 points  Total Score 4 points    Immunizations Immunization History  Administered Date(s) Administered   PFIZER(Purple Top)SARS-COV-2 Vaccination 06/13/2019, 07/10/2019   Zoster Recombinat (Shingrix) 08/08/2022    TDAP status: Due, Education has been provided regarding the importance of this vaccine. Advised may receive this vaccine at local pharmacy or Health Dept. Aware to provide a copy of the vaccination record if obtained from local pharmacy or Health Dept. Verbalized acceptance and understanding.  Flu Vaccine status: Due, Education has been provided regarding the importance of this vaccine. Advised may receive this vaccine at local pharmacy or Health Dept. Aware to provide a copy of the vaccination record if obtained from local pharmacy or Health Dept. Verbalized acceptance and understanding.  Pneumococcal vaccine status: Due, Education has been provided regarding the importance of this vaccine. Advised may receive this vaccine at local pharmacy or Health Dept. Aware to provide a copy of the vaccination record if obtained from local pharmacy or Health Dept.  Verbalized acceptance and understanding.  Covid-19 vaccine status: Completed vaccines  Qualifies for Shingles Vaccine? Yes   Zostavax completed   Shingrix Completed?: Yes  pt had 1 shot at appointment  Screening Tests Health Maintenance  Topic Date Due   Pneumonia Vaccine 22+ Years old (1 of 2 - PCV) Never done   Hepatitis C Screening  Never done   DTaP/Tdap/Td (1 - Tdap) Never done   Lung Cancer Screening  11/22/2019   COVID-19 Vaccine (3 - 2023-24 season) 08/24/2022 (Originally 12/13/2021)   Zoster Vaccines- Shingrix (2 of 2) 10/03/2022   INFLUENZA VACCINE  11/13/2022   MAMMOGRAM  05/25/2023   Medicare Annual Wellness (AWV)  08/08/2023   COLONOSCOPY (Pts 45-60yrs Insurance coverage will need to be confirmed)  11/17/2024   DEXA SCAN  Completed   HPV VACCINES  Aged Out    Health Maintenance  Health Maintenance Due  Topic Date Due   Pneumonia Vaccine 4+ Years old (1 of 2 - PCV) Never done   Hepatitis C Screening  Never done   DTaP/Tdap/Td (1 - Tdap) Never done   Lung Cancer Screening  11/22/2019     Lung Cancer Screening: (Low Dose CT Chest recommended if Age 36-80 years, 30 pack-year currently smoking OR have quit w/in 15years.) does qualify.   Lung Cancer Screening Referral: no  Additional Screening:  Hepatitis C Screening: does qualify; not completed  Vision Screening: Recommended annual ophthalmology exams for early detection of glaucoma and other disorders of the eye. Is the patient up to date with their annual eye exam?  No  Who is the provider or what is  the name of the office in which the patient attends annual eye exams? Dr.Bell If pt is not established with a provider, would they like to be referred to a provider to establish care? No .   Dental Screening: Recommended annual dental exams for proper oral hygiene  Community Resource Referral / Chronic Care Management: CRR required this visit?  No   CCM required this visit?  No      Plan:     I have  personally reviewed and noted the following in the patient's chart:   Medical and social history Use of alcohol, tobacco or illicit drugs  Current medications and supplements including opioid prescriptions. Patient is not currently taking opioid prescriptions. Functional ability and status Nutritional status Physical activity Advanced directives List of other physicians Hospitalizations, surgeries, and ER visits in previous 12 months Vitals Screenings to include cognitive, depression, and falls Referrals and appointments  In addition, I have reviewed and discussed with patient certain preventive protocols, quality metrics, and best practice recommendations. A written personalized care plan for preventive services as well as general preventive health recommendations were provided to patient.     Eulis Canner Shelley White, CMA   08/08/2022   Nurse Notes: None

## 2022-08-08 NOTE — Patient Instructions (Signed)
-  It was a pleasure to see you today! Please review your visit summary for helpful information -Lab results are usually available within 1-2 days and we will call once reviewed -I would encourage you to follow your care via MyChart where you can access lab results, notes, messages, and more -If you feel that we did a nice job today, please complete your after-visit survey and leave us a Google review! Your CMA today was Kieandra and your provider was Dan Charleen Madera, PA-C, DMSc -Please return for follow-up in about 1 year  

## 2022-08-09 LAB — B12 AND FOLATE PANEL
Folate: >20 ng/mL (ref >3.0)
Vitamin B-12: 562 pg/mL (ref 232–1245)

## 2022-08-09 LAB — CBC WITH DIFFERENTIAL/PLATELET
Basophils Absolute: 0 10*3/uL (ref 0.0–0.2)
Basos: 1 %
EOS (ABSOLUTE): 0.2 10*3/uL (ref 0.0–0.4)
Eos: 3 %
Hematocrit: 35.9 % (ref 34.0–46.6)
Hemoglobin: 11.7 g/dL (ref 11.1–15.9)
Immature Grans (Abs): 0 10*3/uL (ref 0.0–0.1)
Immature Granulocytes: 0 %
Lymphocytes Absolute: 2.1 10*3/uL (ref 0.7–3.1)
Lymphs: 36 %
MCH: 28.7 pg (ref 26.6–33.0)
MCHC: 32.6 g/dL (ref 31.5–35.7)
MCV: 88 fL (ref 79–97)
Monocytes Absolute: 0.4 10*3/uL (ref 0.1–0.9)
Monocytes: 6 %
Neutrophils Absolute: 3.2 10*3/uL (ref 1.4–7.0)
Neutrophils: 54 %
Platelets: 270 10*3/uL (ref 150–450)
RBC: 4.07 x10E6/uL (ref 3.77–5.28)
RDW: 13.5 % (ref 11.7–15.4)
WBC: 5.9 10*3/uL (ref 3.4–10.8)

## 2022-08-09 LAB — COMPREHENSIVE METABOLIC PANEL
ALT: 21 IU/L (ref 0–32)
AST: 21 IU/L (ref 0–40)
Albumin/Globulin Ratio: 1.8 (ref 1.2–2.2)
Albumin: 4.4 g/dL (ref 3.9–4.9)
Alkaline Phosphatase: 88 IU/L (ref 44–121)
BUN/Creatinine Ratio: 13 (ref 12–28)
BUN: 10 mg/dL (ref 8–27)
Bilirubin Total: 0.4 mg/dL (ref 0.0–1.2)
CO2: 23 mmol/L (ref 20–29)
Calcium: 9.4 mg/dL (ref 8.7–10.3)
Chloride: 105 mmol/L (ref 96–106)
Creatinine, Ser: 0.78 mg/dL (ref 0.57–1.00)
Globulin, Total: 2.4 g/dL (ref 1.5–4.5)
Glucose: 77 mg/dL (ref 70–99)
Potassium: 4.3 mmol/L (ref 3.5–5.2)
Sodium: 141 mmol/L (ref 134–144)
Total Protein: 6.8 g/dL (ref 6.0–8.5)
eGFR: 83 mL/min/{1.73_m2} (ref >59)

## 2022-08-09 LAB — LIPID PANEL
Chol/HDL Ratio: 3 ratio (ref 0.0–4.4)
Cholesterol, Total: 190 mg/dL (ref 100–199)
HDL: 63 mg/dL (ref >39)
LDL Chol Calc (NIH): 104 mg/dL — ABNORMAL HIGH (ref 0–99)
Triglycerides: 134 mg/dL (ref 0–149)
VLDL Cholesterol Cal: 23 mg/dL (ref 5–40)

## 2022-08-09 LAB — VITAMIN D 25 HYDROXY (VIT D DEFICIENCY, FRACTURES): Vit D, 25-Hydroxy: 42 ng/mL (ref 30.0–100.0)

## 2022-08-09 LAB — HEPATITIS C ANTIBODY: Hep C Virus Ab: NONREACTIVE

## 2022-08-09 LAB — TSH: TSH: 1.01 u[IU]/mL (ref 0.450–4.500)

## 2022-08-11 ENCOUNTER — Other Ambulatory Visit: Payer: Self-pay

## 2022-08-11 MED ORDER — SIMVASTATIN 10 MG PO TABS: 10.0000 mg | ORAL_TABLET | Freq: Every day | ORAL | 1 refills | Status: DC

## 2022-08-16 DIAGNOSIS — R002 Palpitations: Secondary | ICD-10-CM

## 2022-08-21 ENCOUNTER — Ambulatory Visit: Payer: No Typology Code available for payment source

## 2022-08-26 ENCOUNTER — Ambulatory Visit: Payer: Medicare HMO | Admitting: Family Medicine

## 2022-09-01 ENCOUNTER — Telehealth: Payer: No Typology Code available for payment source | Admitting: Family Medicine

## 2022-09-01 DIAGNOSIS — J069 Acute upper respiratory infection, unspecified: Secondary | ICD-10-CM

## 2022-09-01 MED ORDER — AZELASTINE HCL 0.1 % NA SOLN
2.0000 | Freq: Two times a day (BID) | NASAL | 12 refills | Status: AC
Start: 2022-09-01 — End: ?

## 2022-09-01 NOTE — Progress Notes (Signed)

## 2022-09-07 ENCOUNTER — Encounter: Payer: Self-pay | Admitting: Emergency Medicine

## 2022-09-07 ENCOUNTER — Ambulatory Visit
Admission: EM | Admit: 2022-09-07 | Discharge: 2022-09-07 | Disposition: A | Discharge status: Home / Self Care | Payer: No Typology Code available for payment source | Attending: Nurse Practitioner | Admitting: Nurse Practitioner

## 2022-09-07 DIAGNOSIS — J0100 Acute maxillary sinusitis, unspecified: Secondary | ICD-10-CM | POA: Diagnosis not present

## 2022-09-07 MED ORDER — PROMETHAZINE-DM 6.25-15 MG/5ML PO SYRP: 5.0000 mL | ORAL_SOLUTION | Freq: Four times a day (QID) | ORAL | 0 refills | Status: DC | PRN

## 2022-09-07 MED ORDER — FLUCONAZOLE 150 MG PO TABS: 150.0000 mg | ORAL_TABLET | Freq: Once | ORAL | 0 refills | Status: AC

## 2022-09-07 MED ORDER — AMOXICILLIN-POT CLAVULANATE 875-125 MG PO TABS: 1.0000 | ORAL_TABLET | Freq: Two times a day (BID) | ORAL | 0 refills | Status: DC

## 2022-09-07 NOTE — ED Provider Notes (Signed)
[facility]    CSN: [id number] Arrival date & time: 09/07/22  0857      History   Chief Complaint Chief Complaint  Patient presents with   Cough    And congested - Entered by patient    HPI Shelley White is a 67 y.o. female.   The history is provided by the patient.   The patient presents with a 1 week history of sore throat, nasal congestion, sinus pressure, postnasal drainage, and cough.  Patient denies fever, chills, wheezing, shortness of breath, difficulty breathing, chest pain, abdominal pain, nausea, vomiting, or diarrhea.  Patient reports that she did complete an e-visit earlier this week and she was given a nasal spray.  She states that that has not helped her symptoms, but it did cause her nose to bleed.  She has also been taking over-the-counter cough and cold medication with minimal relief.    Past Medical History:  Diagnosis Date   Anxiety    Arthritis    left arm   COVID-19 10/2018   Depression    Diverticulosis    GERD (gastroesophageal reflux disease)     Patient Active Problem List   Diagnosis Date Noted   Gastroesophageal reflux disease 08/08/2022   History of vitamin D deficiency 08/08/2022   Osteopenia after menopause 08/08/2022   Diverticulosis 07/10/2022   Former smoker 07/10/2022   Mild anxiety 07/10/2022   Palpitations 07/10/2022   Encounter for screening colonoscopy    Polyp of colon    Hematuria 01/03/2019   Chest pain 12/08/2017    Past Surgical History:  Procedure Laterality Date   ABDOMINAL HYSTERECTOMY     BREAST BIOPSY Right    ADH   BREAST LUMPECTOMY Right 2007   ADH   COLONOSCOPY WITH PROPOFOL N/A 11/18/2019   Procedure: COLONOSCOPY WITH PROPOFOL;  Surgeon: Pasty Spillers, MD;  Location: [facility];  Service: Endoscopy;  Laterality: N/A;   CYSTOSCOPY WITH BIOPSY N/A 07/08/2019   Procedure: CYSTOSCOPY WITH  BIOPSY;  Surgeon: Sondra Come, MD;  Location: [facility];  Service: Urology;  Laterality: N/A;    TONSILLECTOMY     age 37   URETEROSCOPY Bilateral 07/08/2019   Procedure: DIAGNOSTIC URETEROSCOPY;  Surgeon: Sondra Come, MD;  Location: [facility];  Service: Urology;  Laterality: Bilateral;    OB History     Gravida  1   Para  1   Term  1   Preterm      AB      Living  1      SAB      IAB      Ectopic      Multiple      Live Births               Home Medications    Prior to Admission medications   Medication Sig Start Date End Date Taking? Authorizing Provider  amoxicillin-clavulanate (AUGMENTIN) 875-125 MG tablet Take 1 tablet by mouth every 12 (twelve) hours. 09/07/22  Yes Manie Bealer-Warren, Sadie Haber, NP  promethazine-dextromethorphan (PROMETHAZINE-DM) 6.25-15 MG/5ML syrup Take 5 mLs by mouth 4 (four) times daily as needed for cough. 09/07/22  Yes Yola Paradiso-Warren, Sadie Haber, NP  aspirin EC 81 MG tablet Take 81 mg by mouth daily. Swallow whole.    [provider]  azelastine (ASTELIN) 0.1 % nasal spray Place 2 sprays into both nostrils 2 (two) times daily. Use in each nostril as directed 09/01/22   Freddy Finner, NP  buPROPion (  WELLBUTRIN SR) 150 MG 12 hr tablet Take 1 tablet (150 mg total) by mouth 2 (two) times daily. 08/08/22 08/03/23  Remo Lipps, PA  cetirizine (ZYRTEC) 10 MG tablet Take 10 mg by mouth at bedtime.     [provider]  hydrOXYzine (ATARAX) 25 MG tablet Take 1 tablet (25 mg total) by mouth every 8 (eight) hours as needed for anxiety. 08/08/22   Remo Lipps, PA  Omega-3 Fatty Acids (OMEGA 3 500) 500 MG CAPS Take 500 mg by mouth daily.    [provider]  omeprazole (PRILOSEC) 40 MG capsule Take 1 capsule (40 mg total) by mouth daily. 08/08/22   Remo Lipps, PA  simvastatin (ZOCOR) 10 MG tablet Take 1 tablet (10 mg total) by mouth at bedtime. 08/11/22   Remo Lipps, PA  venlafaxine XR (EFFEXOR-XR) 37.5 MG 24 hr capsule Take 1 capsule (37.5 mg total) by mouth daily. 08/08/22   Remo Lipps, PA   zinc gluconate 50 MG tablet Take 50 mg by mouth daily.    [provider]    Family History Family History  Problem Relation Age of Onset   Stroke Mother    Hypertension Mother    Diabetes Mother    Cancer Father    Colon cancer Father    Breast cancer Neg Hx     Social History Social History   Tobacco Use   Smoking status: Former    Packs/day: 1.00    Years: 30.00    Additional pack years: 0.00    Total pack years: 30.00    Types: Cigarettes    Quit date: 06/13/2022    Years since quitting: 0.2   Smokeless tobacco: Never  Vaping Use   Vaping Use: Never used  Substance Use Topics   Alcohol use: No   Drug use: No     Allergies   Codeine   Review of Systems Review of Systems Per HPI  Physical Exam Triage Vital Signs ED Triage Vitals  Enc Vitals Group     BP 09/07/22 0903 117/76     Pulse Rate 09/07/22 0903 93     Resp 09/07/22 0903 18     Temp 09/07/22 0903 98.6 F (37 C)     Temp Source 09/07/22 0903 Oral     SpO2 09/07/22 0903 92 %     Weight --      Height --      Head Circumference --      Peak Flow --      Pain Score 09/07/22 0905 0     Pain Loc --      Pain Edu? --      Excl. in GC? --    No data found.  Updated Vital Signs BP 117/76 (BP Location: Right Arm)   Pulse 93   Temp 98.6 F (37 C) (Oral)   Resp 18   SpO2 92%   Visual Acuity Right Eye Distance:   Left Eye Distance:   Bilateral Distance:    Right Eye Near:   Left Eye Near:    Bilateral Near:     Physical Exam Vitals and nursing note reviewed.  Constitutional:      General: She is not in acute distress.    Appearance: Normal appearance.  HENT:     Head: Normocephalic.     Right Ear: Tympanic membrane, ear canal and external ear normal.     Left Ear: Tympanic membrane, ear canal and external ear  normal.     Nose: Congestion present.     Right Turbinates: Enlarged and swollen.     Left Turbinates: Enlarged and swollen.     Right Sinus: Maxillary sinus  tenderness present.     Left Sinus: Maxillary sinus tenderness present.     Mouth/Throat:     Lips: Pink.     Mouth: Mucous membranes are moist.     Pharynx: Oropharynx is clear. Uvula midline. Posterior oropharyngeal erythema present. No oropharyngeal exudate.     Comments: Cobblestoning present with moderate postnasal drainage. Eyes:     Extraocular Movements: Extraocular movements intact.     Pupils: Pupils are equal, round, and reactive to light.  Cardiovascular:     Rate and Rhythm: Normal rate and regular rhythm.     Pulses: Normal pulses.     Heart sounds: Normal heart sounds.  Pulmonary:     Effort: Pulmonary effort is normal. No respiratory distress.     Breath sounds: Normal breath sounds. No stridor. No wheezing, rhonchi or rales.  Abdominal:     General: Bowel sounds are normal.     Palpations: Abdomen is soft.     Tenderness: There is no abdominal tenderness.  Musculoskeletal:     Cervical back: Normal range of motion.  Lymphadenopathy:     Cervical: No cervical adenopathy.  Skin:    General: Skin is warm and dry.  Neurological:     General: No focal deficit present.     Mental Status: She is alert and oriented to person, place, and time.  Psychiatric:        Mood and Affect: Mood normal.        Behavior: Behavior normal.      UC Treatments / Results  Labs (all labs ordered are listed, but only abnormal results are displayed) Labs Reviewed - No data to display  EKG   Radiology No results found.  Procedures Procedures (including critical care time)  Medications Ordered in UC Medications - No data to display  Initial Impression / Assessment and Plan / UC Course  I have reviewed the triage vital signs and the nursing notes.  Pertinent labs & imaging results that were available during my care of the patient were reviewed by me and considered in my medical decision making (see chart for details).  The patient is well-appearing, she is in no acute  distress, vital signs are stable.  Suspect acute maxillary sinusitis.  Will treat with Augmentin 875/125 mg and for her cough, Promethazine DM.  Patient was advised to discontinue use of the azelastine nasal spray she was previously prescribed.  Supportive care recommendations were provided and discussed with the patient to include warm salt water gargles for pain or discomfort, use of normal saline nasal spray, and use of a humidifier in the bedroom at nighttime during sleep.  Patient was given follow-up precautions.  Patient is in agreement with this plan of care and verbalizes understanding.  All questions were answered.  Patient stable for discharge.  Final Clinical Impressions(s) / UC Diagnoses   Final diagnoses:  Acute maxillary sinusitis, recurrence not specified     Discharge Instructions      Take medication as prescribed. Increase fluids and allow for plenty rest. May take over-the-counter Tylenol or ibuprofen as needed for pain, fever, general discomfort. May use normal saline nasal spray to help with nasal congestion and runny nose.  Please stop the current nasal spray you are using due to the nosebleeds. Recommend sleeping elevated  on pillows and using a humidifier in the bedroom at nighttime during sleep to help with cough. Warm salt water gargles 3-4 times daily as needed for throat pain or discomfort. If symptoms do not improve with this treatment, please follow-up in this clinic or with your primary care physician for further evaluation. Follow-up as needed.     ED Prescriptions     Medication Sig Dispense Auth. Provider   amoxicillin-clavulanate (AUGMENTIN) 875-125 MG tablet Take 1 tablet by mouth every 12 (twelve) hours. 14 tablet Aleiah Mohammed-Warren, Sadie Haber, NP   promethazine-dextromethorphan (PROMETHAZINE-DM) 6.25-15 MG/5ML syrup Take 5 mLs by mouth 4 (four) times daily as needed for cough. 118 mL Alyene Predmore-Warren, Sadie Haber, NP      PDMP not reviewed this  encounter.   Abran Cantor, NP 09/07/22 [phone]

## 2022-09-07 NOTE — Discharge Instructions (Addendum)
Take medication as prescribed. Increase fluids and allow for plenty rest. May take over-the-counter Tylenol or ibuprofen as needed for pain, fever, general discomfort. May use normal saline nasal spray to help with nasal congestion and runny nose.  Please stop the current nasal spray you are using due to the nosebleeds. Recommend sleeping elevated on pillows and using a humidifier in the bedroom at nighttime during sleep to help with cough. Warm salt water gargles 3-4 times daily as needed for throat pain or discomfort. If symptoms do not improve with this treatment, please follow-up in this clinic or with your primary care physician for further evaluation. Follow-up as needed.

## 2022-09-07 NOTE — ED Triage Notes (Signed)
Cough, nasal congestion x 7 days  productive cough with yellow sputum.

## 2022-09-09 DIAGNOSIS — R002 Palpitations: Secondary | ICD-10-CM | POA: Diagnosis not present

## 2022-09-10 ENCOUNTER — Encounter: Payer: Self-pay | Admitting: Physician Assistant

## 2022-09-10 ENCOUNTER — Encounter: Payer: Self-pay | Admitting: *Deleted

## 2022-09-10 ENCOUNTER — Telehealth: Payer: Self-pay | Admitting: *Deleted

## 2022-09-10 DIAGNOSIS — I471 Supraventricular tachycardia, unspecified: Secondary | ICD-10-CM | POA: Insufficient documentation

## 2022-09-10 NOTE — Progress Notes (Signed)
This encounter was created in error - please disregard.

## 2022-09-10 NOTE — Telephone Encounter (Signed)
The patient wore a ZIO monitor as prescribed by Tillie Fantasia, PA. Dr. Lalla Brothers reviewed the patient's monitor today and send a secure chat message to the cardiology team and the ordering provider stating: Reuel Boom, just finalizing the read for this monitor you ordered for Ms Spaugh. Looks like she is having bursts of SVT lasting > . We'll get her in for cardiology appointment based on our protocol.  Harriett Sine, can you place a cardiology referral for SVT. Can be with general cardiology. Thanks!    Per ZIO results:  Study Highlights  HR 60 - 200, average 88 bpm. Rare supraventricular and ventricular ectopy. No atrial fibrillation. 1610 SVT episodes, longest 63min39 seconds with an average rate of 160 bpm. No symptom activations.   Cardiology referral placed based on protocol for asymptomatic SVT.   Sheria Lang T. Lalla Brothers, MD, [facility], [facility] Cardiac Electrophysiology   I have called and spoken with the patient regarding her monitor results and the need for a cardiac evaluation for SVT. The patient voices understanding of the above and is agreeable with seeing cardiology. She is aware that she will be contacted by our scheduling team for an appointment.   The patient was very appreciative of the call today.

## 2022-09-15 ENCOUNTER — Encounter: Payer: Self-pay | Admitting: Physician Assistant

## 2022-09-15 DIAGNOSIS — E785 Hyperlipidemia, unspecified: Secondary | ICD-10-CM | POA: Insufficient documentation

## 2022-09-17 ENCOUNTER — Other Ambulatory Visit: Payer: Self-pay | Admitting: Physician Assistant

## 2022-09-17 DIAGNOSIS — N644 Mastodynia: Secondary | ICD-10-CM

## 2022-09-22 ENCOUNTER — Inpatient Hospital Stay
Admission: RE | Admit: 2022-09-22 | Discharge: 2022-09-22 | Disposition: A | Discharge status: Home / Self Care | Payer: Self-pay | Source: Ambulatory Visit | Attending: Physician Assistant | Admitting: Physician Assistant

## 2022-09-22 ENCOUNTER — Other Ambulatory Visit: Payer: Self-pay | Admitting: *Deleted

## 2022-09-22 DIAGNOSIS — Z1231 Encounter for screening mammogram for malignant neoplasm of breast: Secondary | ICD-10-CM

## 2022-09-23 ENCOUNTER — Ambulatory Visit: Payer: No Typology Code available for payment source | Attending: Cardiology | Admitting: Cardiology

## 2022-09-23 ENCOUNTER — Encounter: Payer: Self-pay | Admitting: Cardiology

## 2022-09-23 VITALS — BP 140/84 | HR 77 | Resp 98 | Ht 66.0 in | Wt 163.3 lb

## 2022-09-23 DIAGNOSIS — E7800 Pure hypercholesterolemia, unspecified: Secondary | ICD-10-CM | POA: Diagnosis not present

## 2022-09-23 DIAGNOSIS — R0609 Other forms of dyspnea: Secondary | ICD-10-CM | POA: Diagnosis not present

## 2022-09-23 DIAGNOSIS — I471 Supraventricular tachycardia, unspecified: Secondary | ICD-10-CM

## 2022-09-23 DIAGNOSIS — I2089 Other forms of angina pectoris: Secondary | ICD-10-CM

## 2022-09-23 MED ORDER — VERAPAMIL HCL ER 120 MG PO TBCR: 120.0000 mg | EXTENDED_RELEASE_TABLET | Freq: Every day | ORAL | 3 refills | Status: DC

## 2022-09-23 MED ORDER — METOPROLOL TARTRATE 100 MG PO TABS: 100.0000 mg | ORAL_TABLET | Freq: Once | ORAL | 0 refills | Status: DC

## 2022-09-23 NOTE — Patient Instructions (Signed)
Medication Instructions:   Start verapamil - Take one tablet ( 120mg ) by mouth daily.   *If you need a refill on your cardiac medications before your next appointment, please call your pharmacy*   Lab Work:  None ordered  If you have labs (blood work) drawn today and your tests are completely normal, you will receive your results only by: MyChart Message (if you have MyChart) OR A paper copy in the mail If you have any lab test that is abnormal or we need to change your treatment, we will call you to review the results.   Testing/Procedures:  Your physician has requested that you have an echocardiogram. Echocardiography is a painless test that uses sound waves to create images of your heart. It provides your doctor with information about the size and shape of your heart and how well your heart's chambers and valves are working. This procedure takes approximately one hour. There are no restrictions for this procedure. Please do NOT wear cologne, perfume, aftershave, or lotions (deodorant is allowed). Please arrive 15 minutes prior to your appointment time.    Your cardiac CT will be scheduled at   [facility] [address] [place], [place] [address] [phone]  OR   [facility] [address] [place], [place] [address] [phone]  If scheduled at [facility] or [facility], please arrive 15 mins early for check-in and test prep.   Please follow these instructions carefully (unless otherwise directed):  Hold all erectile dysfunction medications at least 3 days (72 hrs) prior to test. (Ie viagra, cialis, sildenafil, tadalafil, etc) We will administer nitroglycerin during this exam.   On the Night Before the Test: Be sure to Drink plenty of water. Do not consume any caffeinated/decaffeinated beverages or chocolate 12 hours prior to your test. Do not  take any antihistamines 12 hours prior to your test.  On the Day of the Test: Drink plenty of water until 1 hour prior to the test. Do not eat any food 1 hour prior to test. You may take your regular medications prior to the test.  Take metoprolol (Lopressor) two hours prior to test. FEMALES- please wear underwire-free bra if available, avoid dresses & tight clothing  After the Test: Drink plenty of water. After receiving IV contrast, you may experience a mild flushed feeling. This is normal. On occasion, you may experience a mild rash up to 24 hours after the test. This is not dangerous. If this occurs, you can take Benadryl 25 mg and increase your fluid intake. If you experience trouble breathing, this can be serious. If it is severe call 911 IMMEDIATELY. If it is mild, please call our office.  For scheduling needs, including cancellations and rescheduling, please call [place], [phone].   Follow-Up: At [facility], you and your health needs are our priority.  As part of our continuing mission to provide you with exceptional heart care, we have created designated Provider Care Teams.  These Care Teams include your primary Cardiologist (physician) and Advanced Practice Providers (APPs -  Physician Assistants and Nurse Practitioners) who all work together to provide you with the care you need, when you need it.  We recommend signing up for the patient portal called "MyChart".  Sign up information is provided on this After Visit Summary.  MyChart is used to connect with patients for Virtual Visits (Telemedicine).  Patients are able to view lab/test results, encounter notes, upcoming  appointments, etc.  Non-urgent messages can be sent to your provider as well.   To learn more about what you can do with MyChart, go to [email or web address].    Your next appointment:    After testing  Provider:   You may see Debbe Odea, MD or one of the following Advanced Practice  Providers on your designated Care Team:   Nicolasa Ducking, NP Eula Listen, PA-C Cadence Fransico Michael, PA-C Charlsie Quest, NP

## 2022-09-23 NOTE — Progress Notes (Signed)
Cardiology Office Note:    Date:  09/23/2022   ID:  ALIANNA HYPPOLITE, DOB 11-06-1955, MRN [id number]  PCP:  Remo Lipps, PA   [facility] Providers Cardiologist:  Debbe Odea, MD     Referring MD: Remo Lipps, PA   Chief Complaint  Patient presents with   New Patient (Initial Visit)    Previously seen by cardiology at [facility] in 2019 (note scanned in chart).  Being seen today due to tachycardia with dyspnea on exertin for past 2 years that is getting worse.  Has stopped smoking in 06/2022 hoping to improve symptoms but still symptomatic.  Heart monitor results on 09/10/22 available in chart.     Shelley White is a 67 y.o. female who is being seen today for the evaluation of shortness of breath and palpitations at the request of Remo Lipps, [place].   History of Present Illness:    Shelley White is a 67 y.o. female with a hx of hyperlipidemia, paroxysmal SVT, former smoker x 30+ years, depression who presents due to shortness of breath and tachycardia.  States having symptoms of shortness of breath with exertion over the past 1 to 2 years, worsening of late.  Also endorses palpitations occurring at least 1-2 times a week, lasting several seconds.  Symptoms of palpitations associated with shortness of breath.  Denies chest pain, denies dizziness or syncope.  Recently diagnosed with hyperlipidemia, started on simvastatin.  Had a 2-week cardiac monitor placed due to palpitations.  Cardiac monitor 08/2022 frequent SVT episodes longest lasting 1 minute 39 seconds.  Past Medical History:  Diagnosis Date   Anxiety    Arthritis    left arm   COVID-19 10/2018   Depression    Diverticulosis    GERD (gastroesophageal reflux disease)     Past Surgical History:  Procedure Laterality Date   ABDOMINAL HYSTERECTOMY     BREAST BIOPSY Right    ADH   BREAST LUMPECTOMY Right 2007   ADH   COLONOSCOPY WITH PROPOFOL N/A 11/18/2019   Procedure:  COLONOSCOPY WITH PROPOFOL;  Surgeon: Pasty Spillers, MD;  Location: [facility] ENDOSCOPY;  Service: Endoscopy;  Laterality: N/A;   CYSTOSCOPY WITH BIOPSY N/A 07/08/2019   Procedure: CYSTOSCOPY WITH  BIOPSY;  Surgeon: Sondra Come, MD;  Location: [facility] ORS;  Service: Urology;  Laterality: N/A;   TONSILLECTOMY     age 35   URETEROSCOPY Bilateral 07/08/2019   Procedure: DIAGNOSTIC URETEROSCOPY;  Surgeon: Sondra Come, MD;  Location: [facility] ORS;  Service: Urology;  Laterality: Bilateral;    Current Medications: Current Meds  Medication Sig   aspirin EC 81 MG tablet Take 81 mg by mouth daily. Swallow whole.   buPROPion (WELLBUTRIN SR) 150 MG 12 hr tablet Take 1 tablet (150 mg total) by mouth 2 (two) times daily.   cetirizine (ZYRTEC) 10 MG tablet Take 10 mg by mouth at bedtime.    hydrOXYzine (ATARAX) 25 MG tablet Take 1 tablet (25 mg total) by mouth every 8 (eight) hours as needed for anxiety.   metoprolol tartrate (LOPRESSOR) 100 MG tablet Take 1 tablet (100 mg total) by mouth once for 1 dose. TWO HOURS PRIOR TO CARDIAC CTA   Omega-3 Fatty Acids (OMEGA 3 500) 500 MG CAPS Take 500 mg by mouth daily.   omeprazole (PRILOSEC) 40 MG capsule Take 1 capsule (40 mg total) by mouth daily.   simvastatin (ZOCOR) 10 MG tablet Take 1 tablet (10 mg total) by mouth  at bedtime.   venlafaxine XR (EFFEXOR-XR) 37.5 MG 24 hr capsule Take 1 capsule (37.5 mg total) by mouth daily.   verapamil (CALAN-SR) 120 MG CR tablet Take 1 tablet (120 mg total) by mouth daily.   VITAMIN D, CHOLECALCIFEROL, PO 1 tablet daily.   zinc gluconate 50 MG tablet Take 50 mg by mouth daily.     Allergies:   Codeine   Social History   Socioeconomic History   Marital status: Married    Spouse name: Not on file   Number of children: 1   Years of education: Not on file   Highest education level: Not on file  Occupational History   Not on file  Tobacco Use   Smoking status: Former    Packs/day: 1.00    Years: 30.00     Additional pack years: 0.00    Total pack years: 30.00    Types: Cigarettes    Quit date: 06/13/2022    Years since quitting: 0.2   Smokeless tobacco: Never  Vaping Use   Vaping Use: Never used  Substance and Sexual Activity   Alcohol use: No   Drug use: No   Sexual activity: Yes    Birth control/protection: Post-menopausal  Other Topics Concern   Not on file  Social History Narrative   Not on file   Social Determinants of Health   Financial Resource Strain: Low Risk  (08/08/2022)   Overall Financial Resource Strain (CARDIA)    Difficulty of Paying Living Expenses: Not hard at all  Food Insecurity: No Food Insecurity (08/08/2022)   Hunger Vital Sign    Worried About Running Out of Food in the Last Year: Never true    Ran Out of Food in the Last Year: Never true  Transportation Needs: No Transportation Needs (08/08/2022)   PRAPARE - Administrator, Civil Service (Medical): No    Lack of Transportation (Non-Medical): No  Physical Activity: Not on file  Stress: Not on file  Social Connections: Not on file     Family History: The patient's family history includes Cancer in her father; Colon cancer in her father; Diabetes in her mother; Heart attack in her brother; Hypertension in her mother; Stroke in her mother. There is no history of Breast cancer.  ROS:   Please see the history of present illness.     All other systems reviewed and are negative.  EKGs/Labs/Other Studies Reviewed:    The following studies were reviewed today:   EKG:  EKG is  ordered today.  The ekg ordered today demonstrates normal sinus rhythm, normal ECG  Recent Labs: 08/08/2022: ALT 21; BUN 10; Creatinine, Ser 0.78; Hemoglobin 11.7; Platelets 270; Potassium 4.3; Sodium 141; TSH 1.010  Recent Lipid Panel    Component Value Date/Time   CHOL 190 08/08/2022 1207   TRIG 134 08/08/2022 1207   HDL 63 08/08/2022 1207   CHOLHDL 3.0 08/08/2022 1207   LDLCALC 104 (H) 08/08/2022 1207     Risk  Assessment/Calculations:     HYPERTENSION CONTROL Vitals:   09/23/22 0938 09/23/22 0939  BP: 134/82 (!) 140/84    The patient's blood pressure is elevated above target today.  In order to address the patient's elevated BP: Blood pressure will be monitored at home to determine if medication changes need to be made.            Physical Exam:    VS:  BP (!) 140/84 (BP Location: Right Arm, Patient Position: Sitting, Cuff Size:  Normal)   Pulse 77   Resp (!) 98   Ht 5\' 6"  (1.676 m)   Wt 163 lb 4.8 oz (74.1 kg)   BMI 26.36 kg/m     Wt Readings from Last 3 Encounters:  09/23/22 163 lb 4.8 oz (74.1 kg)  08/08/22 164 lb (74.4 kg)  08/08/22 164 lb (74.4 kg)     GEN:  Well nourished, well developed in no acute distress HEENT: Normal NECK: No JVD; No carotid bruits CARDIAC: RRR, no murmurs, rubs, gallops RESPIRATORY:  Clear to auscultation without rales, wheezing or rhonchi  ABDOMEN: Soft, non-tender, non-distended MUSCULOSKELETAL:  No edema; No deformity  SKIN: Warm and dry NEUROLOGIC:  Alert and oriented x 3 PSYCHIATRIC:  Normal affect   ASSESSMENT:    1. Dyspnea on exertion   2. Paroxysmal SVT (supraventricular tachycardia)   3. Pure hypercholesterolemia   4. Anginal equivalent    PLAN:    In order of problems listed above:  Dyspnea on exertion, this could be an anginal equivalent, risk factors include former smoker, hyperlipidemia.  Get echocardiogram, get coronary CTA.  Consider pulmonary etiology if cardiac testing is unrevealing. Paroxysmal SVT, symptoms of palpitations.  Start verapamil 120 mg daily.  Echo as above. Hyperlipidemia, recently started on simvastatin.  Continue Zocor 10 mg daily.   Follow-up after cardiac testing.      Medication Adjustments/Labs and Tests Ordered: Current medicines are reviewed at length with the patient today.  Concerns regarding medicines are outlined above.  Orders Placed This Encounter  Procedures   CT CORONARY MORPH  W/CTA COR W/SCORE W/CA W/CM &/OR WO/CM   ECHOCARDIOGRAM COMPLETE   Meds ordered this encounter  Medications   verapamil (CALAN-SR) 120 MG CR tablet    Sig: Take 1 tablet (120 mg total) by mouth daily.    Dispense:  90 tablet    Refill:  3   metoprolol tartrate (LOPRESSOR) 100 MG tablet    Sig: Take 1 tablet (100 mg total) by mouth once for 1 dose. TWO HOURS PRIOR TO CARDIAC CTA    Dispense:  1 tablet    Refill:  0    Patient Instructions  Medication Instructions:   Start verapamil - Take one tablet ( 120mg ) by mouth daily.   *If you need a refill on your cardiac medications before your next appointment, please call your pharmacy*   Lab Work:  None ordered  If you have labs (blood work) drawn today and your tests are completely normal, you will receive your results only by: MyChart Message (if you have MyChart) OR A paper copy in the mail If you have any lab test that is abnormal or we need to change your treatment, we will call you to review the results.   Testing/Procedures:  Your physician has requested that you have an echocardiogram. Echocardiography is a painless test that uses sound waves to create images of your heart. It provides your doctor with information about the size and shape of your heart and how well your heart's chambers and valves are working. This procedure takes approximately one hour. There are no restrictions for this procedure. Please do NOT wear cologne, perfume, aftershave, or lotions (deodorant is allowed). Please arrive 15 minutes prior to your appointment time.    Your cardiac CT will be scheduled at   [facility] [address] [place], [place] [address] [phone]  OR   [facility] [address] [place], [place] [address] [phone]  [phone]  If scheduled at [facility] or [facility], please arrive 15 mins early for  check-in and test prep.   Please follow these instructions carefully (unless otherwise directed):  Hold all erectile dysfunction medications at least 3 days (72 hrs) prior to test. (Ie viagra, cialis, sildenafil, tadalafil, etc) We will administer nitroglycerin during this exam.   On the Night Before the Test: Be sure to Drink plenty of water. Do not consume any caffeinated/decaffeinated beverages or chocolate 12 hours prior to your test. Do not take any antihistamines 12 hours prior to your test.  On the Day of the Test: Drink plenty of water until 1 hour prior to the test. Do not eat any food 1 hour prior to test. You may take your regular medications prior to the test.  Take metoprolol (Lopressor) two hours prior to test. FEMALES- please wear underwire-free bra if available, avoid dresses & tight clothing  After the Test: Drink plenty of water. After receiving IV contrast, you may experience a mild flushed feeling. This is normal. On occasion, you may experience a mild rash up to 24 hours after the test. This is not dangerous. If this occurs, you can take Benadryl 25 mg and increase your fluid intake. If you experience trouble breathing, this can be serious. If it is severe call 911 IMMEDIATELY. If it is mild, please call our office.  For scheduling needs, including cancellations and rescheduling, please call [place], [phone].   Follow-Up: At [facility], you and your health needs are our priority.  As part of our continuing mission to provide you with exceptional heart care, we have created designated Provider Care Teams.  These Care Teams include your primary Cardiologist (physician) and Advanced Practice Providers (APPs -  Physician Assistants and Nurse Practitioners) who all work together to provide you with the care you need, when you need it.  We recommend signing up for the patient portal called "MyChart".  Sign up information is provided on this After Visit  Summary.  MyChart is used to connect with patients for Virtual Visits (Telemedicine).  Patients are able to view lab/test results, encounter notes, upcoming appointments, etc.  Non-urgent messages can be sent to your provider as well.   To learn more about what you can do with MyChart, go to [email or web address].    Your next appointment:    After testing  Provider:   You may see Debbe Odea, MD or one of the following Advanced Practice Providers on your designated Care Team:   Nicolasa Ducking, NP Eula Listen, PA-C Cadence Fransico Michael, PA-C Charlsie Quest, NP   Signed, Debbe Odea, MD  09/23/2022 11:20 AM    [facility]

## 2022-09-29 NOTE — Addendum Note (Signed)
Addended by: Guerry Minors on: 09/29/2022 01:57 PM   Modules accepted: Orders

## 2022-10-02 ENCOUNTER — Ambulatory Visit: Payer: No Typology Code available for payment source | Attending: Cardiology

## 2022-10-02 DIAGNOSIS — R0609 Other forms of dyspnea: Secondary | ICD-10-CM

## 2022-10-02 LAB — ECHOCARDIOGRAM COMPLETE
Area-P 1/2: 3.48 cm2
S' Lateral: 3 cm

## 2022-10-10 ENCOUNTER — Ambulatory Visit
Admission: RE | Admit: 2022-10-10 | Discharge: 2022-10-10 | Disposition: A | Discharge status: Home / Self Care | Payer: No Typology Code available for payment source | Source: Ambulatory Visit | Attending: Physician Assistant | Admitting: Physician Assistant

## 2022-10-10 DIAGNOSIS — N6310 Unspecified lump in the right breast, unspecified quadrant: Secondary | ICD-10-CM | POA: Diagnosis not present

## 2022-10-10 DIAGNOSIS — R92333 Mammographic heterogeneous density, bilateral breasts: Secondary | ICD-10-CM | POA: Diagnosis not present

## 2022-10-10 DIAGNOSIS — N644 Mastodynia: Secondary | ICD-10-CM

## 2022-10-10 DIAGNOSIS — R92323 Mammographic fibroglandular density, bilateral breasts: Secondary | ICD-10-CM | POA: Diagnosis not present

## 2022-10-17 ENCOUNTER — Ambulatory Visit: Payer: No Typology Code available for payment source | Admitting: Physician Assistant

## 2022-10-24 ENCOUNTER — Telehealth (HOSPITAL_COMMUNITY): Payer: Self-pay | Admitting: *Deleted

## 2022-10-24 NOTE — Telephone Encounter (Signed)
Reaching out to patient to offer assistance regarding upcoming cardiac imaging study; pt verbalizes understanding of appt date/time, but wishes to reschedule since she was diagnosed with COVID.  New appointment made for Aug 5 at 1 pm.  Larey Brick RN Navigator Cardiac Imaging [facility] [phone] office [phone] cell

## 2022-10-27 ENCOUNTER — Ambulatory Visit: Admission: RE | Admit: 2022-10-27 | Payer: No Typology Code available for payment source | Source: Ambulatory Visit

## 2022-11-12 NOTE — Progress Notes (Deleted)
Cardiology Office Note    Date:  11/12/2022   ID:  Aashni, Luff [birth year], MRN [id number]  PCP:  Remo Lipps, PA  Cardiologist:  Debbe Odea, MD  Electrophysiologist:  None   Chief Complaint: Follow up  History of Present Illness:   Shelley White is a 67 y.o. female with history of coronary artery calcification noted on prior CT imaging, PSVT, HLD, and prior tobacco use smoking from 1994 to 06/2022 who presents for follow up of echo.  Minimal coronary artery calcification noted on CT imaging in 11/2018.  She was seen as a new patient on 09/23/2022 by Dr. Azucena Cecil evaluation of exertional shortness of breath over the preceding 1 to 2 years that was progressive as well as palpitations lasting several seconds.  She had previously undergone Zio patch in 07/2022 through her PCPs office which demonstrated predominant rhythm of sinus with an average rate of 88 bpm (range 60 to 200 bpm), 8614 episodes of SVT with the longest lasting 1 minute and 39 seconds with an average rate of 160 bpm.  Upon establishing with cardiology in 09/2022, coronary CTA was ordered and is scheduled for 11/17/2022.  Echo was obtained on 10/02/2022 and demonstrated an EF of 55 to 60%, no regional wall motion abnormalities, grade 1 diastolic dysfunction, normal RV systolic function and ventricular cavity size, mild mitral regurgitation, and an estimated right atrial pressure of 3 mmHg.  ***   Labs independently reviewed: 07/2022 - TC 190, TG 134, HDL 63, LDL 104, TSH normal, BUN 10, serum creatinine 0.78, potassium 4.3, albumin 4.4, AST/ALT normal, Hgb 11.7, PLT 270  Past Medical History:  Diagnosis Date   Anxiety    Arthritis    left arm   COVID-19 10/2018   Depression    Diverticulosis    GERD (gastroesophageal reflux disease)     Past Surgical History:  Procedure Laterality Date   ABDOMINAL HYSTERECTOMY     BREAST BIOPSY Right    ADH   BREAST LUMPECTOMY Right 2007   ADH   COLONOSCOPY  WITH PROPOFOL N/A 11/18/2019   Procedure: COLONOSCOPY WITH PROPOFOL;  Surgeon: Pasty Spillers, MD;  Location: [facility] ENDOSCOPY;  Service: Endoscopy;  Laterality: N/A;   CYSTOSCOPY WITH BIOPSY N/A 07/08/2019   Procedure: CYSTOSCOPY WITH  BIOPSY;  Surgeon: Sondra Come, MD;  Location: [facility] ORS;  Service: Urology;  Laterality: N/A;   TONSILLECTOMY     age 45   URETEROSCOPY Bilateral 07/08/2019   Procedure: DIAGNOSTIC URETEROSCOPY;  Surgeon: Sondra Come, MD;  Location: [facility] ORS;  Service: Urology;  Laterality: Bilateral;    Current Medications: No outpatient medications have been marked as taking for the 11/13/22 encounter (Appointment) with Sondra Barges, PA-C.    Allergies:   Codeine   Social History   Socioeconomic History   Marital status: Married    Spouse name: Not on file   Number of children: 1   Years of education: Not on file   Highest education level: Not on file  Occupational History   Not on file  Tobacco Use   Smoking status: Former    Current packs/day: 0.00    Average packs/day: 1 pack/day for 30.0 years (30.0 ttl pk-yrs)    Types: Cigarettes    Start date: 06/12/1992    Quit date: 06/13/2022    Years since quitting: 0.4   Smokeless tobacco: Never  Vaping Use   Vaping status: Never Used  Substance and Sexual Activity   Alcohol  use: No   Drug use: No   Sexual activity: Yes    Birth control/protection: Post-menopausal  Other Topics Concern   Not on file  Social History Narrative   Not on file   Social Determinants of Health   Financial Resource Strain: Low Risk  (08/08/2022)   Overall Financial Resource Strain (CARDIA)    Difficulty of Paying Living Expenses: Not hard at all  Food Insecurity: No Food Insecurity (08/08/2022)   Hunger Vital Sign    Worried About Running Out of Food in the Last Year: Never true    Ran Out of Food in the Last Year: Never true  Transportation Needs: No Transportation Needs (08/08/2022)   PRAPARE - Doctor, general practice (Medical): No    Lack of Transportation (Non-Medical): No  Physical Activity: Not on file  Stress: Not on file  Social Connections: Not on file     Family History:  The patient's family history includes Cancer in her father; Colon cancer in her father; Diabetes in her mother; Heart attack in her brother; Hypertension in her mother; Stroke in her mother. There is no history of Breast cancer.  ROS:   12-point review of systems is negative unless otherwise noted in the HPI.   EKGs/Labs/Other Studies Reviewed:    Studies reviewed were summarized above. The additional studies were reviewed today:  2D echo 10/02/2022: 1. Left ventricular ejection fraction, by estimation, is 55 to 60%. The  left ventricle has normal function. The left ventricle has no regional  wall motion abnormalities. Left ventricular diastolic parameters are  consistent with Grade I diastolic  dysfunction (impaired relaxation). The average left ventricular global  longitudinal strain is -10.6 %.   2. Right ventricular systolic function is normal. The right ventricular  size is normal. Tricuspid regurgitation signal is inadequate for assessing  PA pressure.   3. The mitral valve is normal in structure. Mild mitral valve  regurgitation. No evidence of mitral stenosis.   4. The aortic valve is tricuspid. Aortic valve regurgitation is not  visualized. No aortic stenosis is present.   5. The inferior vena cava is normal in size with greater than 50%  respiratory variability, suggesting right atrial pressure of 3 mmHg.  __________  Zio patch 07/2022: HR 60 - 200, average 88 bpm. Rare supraventricular and ventricular ectopy. No atrial fibrillation. 1610 SVT episodes, longest 52min39 seconds with an average rate of 160 bpm. No symptom activations.   EKG:  EKG is ordered today.  The EKG ordered today demonstrates ***  Recent Labs: 08/08/2022: ALT 21; BUN 10; Creatinine, Ser 0.78; Hemoglobin 11.7;  Platelets 270; Potassium 4.3; Sodium 141; TSH 1.010  Recent Lipid Panel    Component Value Date/Time   CHOL 190 08/08/2022 1207   TRIG 134 08/08/2022 1207   HDL 63 08/08/2022 1207   CHOLHDL 3.0 08/08/2022 1207   LDLCALC 104 (H) 08/08/2022 1207    PHYSICAL EXAM:    VS:  There were no vitals taken for this visit.  BMI: There is no height or weight on file to calculate BMI.  Physical Exam  Wt Readings from Last 3 Encounters:  09/23/22 163 lb 4.8 oz (74.1 kg)  08/08/22 164 lb (74.4 kg)  08/08/22 164 lb (74.4 kg)     ASSESSMENT & PLAN:   ***  Coronary artery calcification/HLD: CT imaging from 11/2018 documented minimal coronary artery calcification.  Coronary CTA is scheduled for 11/17/2022.  LDL 104 from 07/2022 with target LDL being  at least less than 70.   {Are you ordering a CV Procedure (e.g. stress test, cath, DCCV, TEE, etc)?   Press F2        :[id number]}     Disposition: F/u with Dr. Azucena Cecil or an APP in ***.   Medication Adjustments/Labs and Tests Ordered: Current medicines are reviewed at length with the patient today.  Concerns regarding medicines are outlined above. Medication changes, Labs and Tests ordered today are summarized above and listed in the Patient Instructions accessible in Encounters.   Signed, Eula Listen, PA-C 11/12/2022 5:46 PM     [facility] [place] [address] [place], [place] [address] [phone]

## 2022-11-13 ENCOUNTER — Ambulatory Visit: Payer: No Typology Code available for payment source | Attending: Physician Assistant | Admitting: Physician Assistant

## 2022-11-14 ENCOUNTER — Encounter: Payer: Self-pay | Admitting: Physician Assistant

## 2022-11-14 ENCOUNTER — Telehealth (HOSPITAL_COMMUNITY): Payer: Self-pay | Admitting: Emergency Medicine

## 2022-11-14 NOTE — Telephone Encounter (Signed)
Pt requesting to cancel appt.

## 2022-11-17 ENCOUNTER — Ambulatory Visit: Payer: No Typology Code available for payment source

## 2022-12-01 DIAGNOSIS — H2513 Age-related nuclear cataract, bilateral: Secondary | ICD-10-CM | POA: Diagnosis not present

## 2022-12-01 DIAGNOSIS — H524 Presbyopia: Secondary | ICD-10-CM | POA: Diagnosis not present

## 2022-12-17 DIAGNOSIS — H524 Presbyopia: Secondary | ICD-10-CM | POA: Diagnosis not present

## 2022-12-17 DIAGNOSIS — H52223 Regular astigmatism, bilateral: Secondary | ICD-10-CM | POA: Diagnosis not present

## 2022-12-30 ENCOUNTER — Encounter (HOSPITAL_COMMUNITY): Payer: Self-pay

## 2022-12-31 ENCOUNTER — Telehealth (HOSPITAL_COMMUNITY): Payer: Self-pay | Admitting: Emergency Medicine

## 2022-12-31 NOTE — Telephone Encounter (Signed)
Attempted to call patient regarding upcoming cardiac CT appointment. °Left message on voicemail with name and callback number °Dwan Fennel RN Navigator Cardiac Imaging °[facility] °[phone] Office °[phone] Cell ° °

## 2023-01-01 ENCOUNTER — Ambulatory Visit
Admission: RE | Admit: 2023-01-01 | Discharge: 2023-01-01 | Disposition: A | Discharge status: Home / Self Care | Payer: No Typology Code available for payment source | Source: Ambulatory Visit | Attending: Cardiology | Admitting: Cardiology

## 2023-01-01 DIAGNOSIS — I25118 Atherosclerotic heart disease of native coronary artery with other forms of angina pectoris: Secondary | ICD-10-CM | POA: Diagnosis not present

## 2023-01-01 DIAGNOSIS — R0609 Other forms of dyspnea: Secondary | ICD-10-CM | POA: Insufficient documentation

## 2023-01-01 DIAGNOSIS — E7800 Pure hypercholesterolemia, unspecified: Secondary | ICD-10-CM | POA: Diagnosis not present

## 2023-01-01 DIAGNOSIS — Z79899 Other long term (current) drug therapy: Secondary | ICD-10-CM | POA: Insufficient documentation

## 2023-01-01 MED ORDER — SODIUM CHLORIDE 0.9 % IV BOLUS
150.0000 mL | Freq: Once | INTRAVENOUS | Status: AC
  Administered 2023-01-01: 150 mL via INTRAVENOUS

## 2023-01-01 MED ORDER — DILTIAZEM HCL 25 MG/5ML IV SOLN
5.0000 mg | INTRAVENOUS | Status: DC | PRN
  Administered 2023-01-01: 5 mg via INTRAVENOUS

## 2023-01-01 MED ORDER — IOHEXOL 350 MG/ML SOLN
75.0000 mL | Freq: Once | INTRAVENOUS | Status: AC | PRN
  Administered 2023-01-01: 75 mL via INTRAVENOUS

## 2023-01-01 MED ORDER — METOPROLOL TARTRATE 5 MG/5ML IV SOLN
10.0000 mg | Freq: Once | INTRAVENOUS | Status: AC | PRN
  Administered 2023-01-01: 10 mg via INTRAVENOUS

## 2023-01-01 MED ORDER — NITROGLYCERIN 0.4 MG SL SUBL
0.8000 mg | SUBLINGUAL_TABLET | Freq: Once | SUBLINGUAL | Status: AC
  Administered 2023-01-01: 0.8 mg via SUBLINGUAL

## 2023-01-01 NOTE — Progress Notes (Signed)
Patient tolerated CT well. Drank water after. Vital signs stable encourage to drink water throughout day.Reasons explained and verbalized understanding. Ambulated steady gait.  

## 2023-01-05 ENCOUNTER — Ambulatory Visit: Admission: RE | Admit: 2023-01-05 | Payer: No Typology Code available for payment source | Source: Ambulatory Visit

## 2023-01-07 ENCOUNTER — Other Ambulatory Visit: Payer: Self-pay | Admitting: Physician Assistant

## 2023-01-08 ENCOUNTER — Other Ambulatory Visit: Payer: Self-pay | Admitting: Physician Assistant

## 2023-01-08 ENCOUNTER — Ambulatory Visit: Payer: Self-pay

## 2023-01-08 DIAGNOSIS — Z87891 Personal history of nicotine dependence: Secondary | ICD-10-CM

## 2023-01-08 MED ORDER — BUPROPION HCL ER (XL) 150 MG PO TB24: 150.0000 mg | ORAL_TABLET | Freq: Every day | ORAL | 3 refills | Status: DC

## 2023-01-08 MED ORDER — SIMVASTATIN 10 MG PO TABS: 10.0000 mg | ORAL_TABLET | Freq: Every day | ORAL | 1 refills | Status: DC

## 2023-01-08 NOTE — Telephone Encounter (Signed)
Requested Prescriptions  Pending Prescriptions Disp Refills   simvastatin (ZOCOR) 10 MG tablet 90 tablet 1    Sig: Take 1 tablet (10 mg total) by mouth at bedtime.     Cardiovascular:  Antilipid - Statins Failed - 01/08/2023  1:22 PM      Failed - Lipid Panel in normal range within the last 12 months    Cholesterol, Total  Date Value Ref Range Status  08/08/2022 190 100 - 199 mg/dL Final   LDL Chol Calc (NIH)  Date Value Ref Range Status  08/08/2022 104 (H) 0 - 99 mg/dL Final   HDL  Date Value Ref Range Status  08/08/2022 63 >39 mg/dL Final   Triglycerides  Date Value Ref Range Status  08/08/2022 134 0 - 149 mg/dL Final         Passed - Patient is not pregnant      Passed - Valid encounter within last 12 months    Recent Outpatient Visits           5 months ago Annual physical exam   [facility] at [facility], [place], [place]   6 months ago Acute gastroenteritis   [facility] at [facility], [place] Al[place], [place]       Future Appointments             In 4 weeks Mordecai Maes, [place] Al[place], [place] [facility] at [facility], [facility]

## 2023-01-08 NOTE — Telephone Encounter (Signed)
Summary: Question why her medication was changed   Shelley White stated that she is currently taking: buPROPion (WELLBUTRIN SR) 150 MG 12 h  she states this medications smells bad.   She is questioning why was her previous med changed:  buPROPion (WELLBUTRIN SR) 150 MG 12 hr tablet     Chief Complaint: Pt. Requests to go back on bupropion ER 150 mg 24 hour tablet. "This other one smells bad."  No symptoms Symptoms: Above Frequency:  Pertinent Negatives: Patient denies any symptoms Disposition: [] ED /[] Urgent Care (no appt availability in office) / [] Appointment(In office/virtual)/ []  [place] [facility]/ [] Home Care/ [] Refused Recommended Disposition /[] [place] [facility]/ [x]  Follow-up with PCP Additional Notes: Please advise pt.  Reason for Disposition  [1] Caller has URGENT medicine question about med that PCP or specialist prescribed AND [2] triager unable to answer question  Answer Assessment - Initial Assessment Questions 1. NAME of MEDICINE: "What medicine(s) are you calling about?"     Bupropion SR 150 MG SR 12 hour 2. QUESTION: "What is your question?" (e.g., double dose of medicine, side effect)     Wants to go back on Bupropion 150 mg 24 hour tablet  3. PRESCRIBER: "Who prescribed the medicine?" Reason: if prescribed by specialist, call should be referred to that group.     [place] 4. SYMPTOMS: "Do you have any symptoms?" If Yes, ask: "What symptoms are you having?"  "How bad are the symptoms (e.g., mild, moderate, severe)     None 5. PREGNANCY:  "Is there any chance that you are pregnant?" "When was your last menstrual period?"     no  Protocols used: Medication Question Call-A-AH

## 2023-01-08 NOTE — Telephone Encounter (Signed)
Please review.  KP

## 2023-01-08 NOTE — Telephone Encounter (Signed)
Medication Refill - Medication: mvastatin (ZOCOR) 10 MG tablet [Pharmacy Med Name: SIMVASTATIN 10 MG TABLET]  Has the patient contacted their pharmacy? Yes.    [facility] [id number] [place], [place] - [address]  [address] [place], [place] [address]  Phone:  [phone]  Fax:  [phone]  DEA #:  -- Preferred Pharmacy (with phone number or street name):    Has the patient been seen for an appointment in the last year OR does the patient have an upcoming appointment? Yes.    Agent: Please be advised that RX refills may take up to 3 business days. We ask that you follow-up with your pharmacy.

## 2023-01-08 NOTE — Telephone Encounter (Signed)
Rx 08/11/22 #90 1RF- too soon Requested Prescriptions  Pending Prescriptions Disp Refills   simvastatin (ZOCOR) 10 MG tablet [Pharmacy Med Name: SIMVASTATIN 10 MG TABLET] 90 tablet 1    Sig: TAKE 1 TABLET BY MOUTH AT BEDTIME     Cardiovascular:  Antilipid - Statins Failed - 01/07/2023  6:17 PM      Failed - Lipid Panel in normal range within the last 12 months    Cholesterol, Total  Date Value Ref Range Status  08/08/2022 190 100 - 199 mg/dL Final   LDL Chol Calc (NIH)  Date Value Ref Range Status  08/08/2022 104 (H) 0 - 99 mg/dL Final   HDL  Date Value Ref Range Status  08/08/2022 63 >39 mg/dL Final   Triglycerides  Date Value Ref Range Status  08/08/2022 134 0 - 149 mg/dL Final         Passed - Patient is not pregnant      Passed - Valid encounter within last 12 months    Recent Outpatient Visits           5 months ago Annual physical exam   [facility] at [facility], [place], [place]   6 months ago Acute gastroenteritis   [facility] at [facility], [place], [place]       Future Appointments             In 4 weeks Mordecai Maes, [place], [place] [facility] at [facility], [facility]

## 2023-02-06 ENCOUNTER — Ambulatory Visit (INDEPENDENT_AMBULATORY_CARE_PROVIDER_SITE_OTHER): Payer: No Typology Code available for payment source | Admitting: Physician Assistant

## 2023-02-06 ENCOUNTER — Encounter: Payer: Self-pay | Admitting: Physician Assistant

## 2023-02-06 VITALS — BP 130/74 | HR 101 | Ht 66.0 in | Wt 162.0 lb

## 2023-02-06 DIAGNOSIS — Z23 Encounter for immunization: Secondary | ICD-10-CM

## 2023-02-06 DIAGNOSIS — E785 Hyperlipidemia, unspecified: Secondary | ICD-10-CM

## 2023-02-06 DIAGNOSIS — I471 Supraventricular tachycardia, unspecified: Secondary | ICD-10-CM

## 2023-02-06 DIAGNOSIS — K219 Gastro-esophageal reflux disease without esophagitis: Secondary | ICD-10-CM

## 2023-02-06 NOTE — Progress Notes (Signed)
Date:  02/06/2023   Name:  Shelley White   DOB:  01-06-1956   MRN:  [id number]   Chief Complaint: Hyperlipidemia  HPI Kennon Rounds is a delightful 67 y.o. female who returns for 18-month follow-up on chronic conditions, overall doing quite well.  Last visit we started low-dose simvastatin to lower her ASCVD risk given her smoking history; LDL was actually quite mild at 104.  She seems to be tolerating the medication well and reports good compliance, no side effects.  Cardiology had prescribed her verapamil for paroxysmal supraventricular tachycardia confirmed on cardiac monitor, but patient reports she self discontinued this medication due to symptomatic improvement and states she thinks her palpitations were caused by anxiety -no recurrent symptoms.    Medication list has been reviewed and updated.  Current Meds  Medication Sig   aspirin EC 81 MG tablet Take 81 mg by mouth daily. Swallow whole.   azelastine (ASTELIN) 0.1 % nasal spray Place 2 sprays into both nostrils 2 (two) times daily. Use in each nostril as directed   buPROPion (WELLBUTRIN XL) 150 MG 24 hr tablet Take 1 tablet (150 mg total) by mouth daily.   cetirizine (ZYRTEC) 10 MG tablet Take 10 mg by mouth at bedtime.    hydrOXYzine (ATARAX) 25 MG tablet Take 1 tablet (25 mg total) by mouth every 8 (eight) hours as needed for anxiety.   Omega-3 Fatty Acids (OMEGA 3 500) 500 MG CAPS Take 500 mg by mouth daily.   omeprazole (PRILOSEC) 40 MG capsule Take 1 capsule (40 mg total) by mouth daily.   simvastatin (ZOCOR) 10 MG tablet Take 1 tablet (10 mg total) by mouth at bedtime.   venlafaxine XR (EFFEXOR-XR) 37.5 MG 24 hr capsule Take 1 capsule (37.5 mg total) by mouth daily.   VITAMIN D, CHOLECALCIFEROL, PO 1 tablet daily.   zinc gluconate 50 MG tablet Take 50 mg by mouth daily.   [DISCONTINUED] verapamil (CALAN-SR) 120 MG CR tablet Take 1 tablet (120 mg total) by mouth daily.     Review of Systems  Constitutional:  Negative for  fatigue and fever.  Respiratory:  Negative for chest tightness and shortness of breath.   Cardiovascular:  Negative for chest pain and palpitations.  Gastrointestinal:  Negative for abdominal pain.    Patient Active Problem List   Diagnosis Date Noted   Mild hyperlipidemia 09/15/2022   Paroxysmal SVT (supraventricular tachycardia) (HCC) 09/10/2022   Gastroesophageal reflux disease 08/08/2022   History of vitamin D deficiency 08/08/2022   Osteopenia after menopause 08/08/2022   Diverticulosis 07/10/2022   Former smoker 07/10/2022   Mild anxiety 07/10/2022   Palpitations 07/10/2022   Encounter for screening colonoscopy    Polyp of colon    Hematuria 01/03/2019   Chest pain 12/08/2017    Allergies  Allergen Reactions   Codeine Itching and Swelling    Immunization History  Administered Date(s) Administered   PFIZER(Purple Top)SARS-COV-2 Vaccination 06/13/2019, 07/10/2019   Zoster Recombinant(Shingrix) 02/26/2022, 08/08/2022    Past Surgical History:  Procedure Laterality Date   ABDOMINAL HYSTERECTOMY     BREAST BIOPSY Right    ADH   BREAST LUMPECTOMY Right 2007   ADH   COLONOSCOPY WITH PROPOFOL N/A 11/18/2019   Procedure: COLONOSCOPY WITH PROPOFOL;  Surgeon: Pasty Spillers, MD;  Location: [facility] ENDOSCOPY;  Service: Endoscopy;  Laterality: N/A;   CYSTOSCOPY WITH BIOPSY N/A 07/08/2019   Procedure: CYSTOSCOPY WITH  BIOPSY;  Surgeon: Sondra Come, MD;  Location: [facility] ORS;  Service:  Urology;  Laterality: N/A;   TONSILLECTOMY     age 89   URETEROSCOPY Bilateral 07/08/2019   Procedure: DIAGNOSTIC URETEROSCOPY;  Surgeon: Sondra Come, MD;  Location: [facility] ORS;  Service: Urology;  Laterality: Bilateral;    Social History   Tobacco Use   Smoking status: Former    Current packs/day: 0.00    Average packs/day: 1 pack/day for 30.0 years (30.0 ttl pk-yrs)    Types: Cigarettes    Start date: 06/12/1992    Quit date: 06/13/2022    Years since quitting: 0.6   Smokeless  tobacco: Never  Vaping Use   Vaping status: Never Used  Substance Use Topics   Alcohol use: No   Drug use: No    Family History  Problem Relation Age of Onset   Stroke Mother    Hypertension Mother    Diabetes Mother    Cancer Father    Colon cancer Father    Heart attack Brother    Breast cancer Neg Hx         02/06/2023    1:20 PM 08/08/2022   10:50 AM 07/10/2022    3:21 PM  GAD 7 : Generalized Anxiety Score  Nervous, Anxious, on Edge 0 1 1  Control/stop worrying 1 1 1   Worry too much - different things 1 1 1   Trouble relaxing 1 1 2   Restless 1 1 2   Easily annoyed or irritable 1 1 1   Afraid - awful might happen 1 1 0  Total GAD 7 Score 6 7 8   Anxiety Difficulty Not difficult at all Not difficult at all Somewhat difficult       02/06/2023    1:20 PM 08/08/2022   10:59 AM 08/08/2022   10:50 AM  Depression screen PHQ 2/9  Decreased Interest 1 1 1   Down, Depressed, Hopeless 0 1 1  PHQ - 2 Score 1 2 2   Altered sleeping 1 1 1   Tired, decreased energy 2 0 0  Change in appetite 0 0 0  Feeling bad or failure about yourself  0 0 1  Trouble concentrating 1 1 1   Moving slowly or fidgety/restless 0 1   Suicidal thoughts 0 0 0  PHQ-9 Score 5 5 5   Difficult doing work/chores Not difficult at all Somewhat difficult Not difficult at all    BP Readings from Last 3 Encounters:  02/06/23 130/74  01/01/23 128/68  09/23/22 (!) 140/84    Wt Readings from Last 3 Encounters:  02/06/23 162 lb (73.5 kg)  09/23/22 163 lb 4.8 oz (74.1 kg)  08/08/22 164 lb (74.4 kg)    BP 130/74 (BP Location: Left Arm, Patient Position: Sitting, Cuff Size: Normal)   Pulse (!) 101   Ht 5\' 6"  (1.676 m)   Wt 162 lb (73.5 kg)   SpO2 96%   BMI 26.15 kg/m   Physical Exam Vitals and nursing note reviewed.  Constitutional:      Appearance: Normal appearance.  Neck:     Vascular: No carotid bruit.  Cardiovascular:     Rate and Rhythm: Normal rate and regular rhythm.     Heart sounds: No  murmur heard.    No friction rub. No gallop.  Pulmonary:     Effort: Pulmonary effort is normal.     Breath sounds: Normal breath sounds.  Abdominal:     General: There is no distension.  Musculoskeletal:        General: Normal range of motion.  Skin:  General: Skin is warm and dry.  Neurological:     Mental Status: She is alert and oriented to person, place, and time.     Gait: Gait is intact.  Psychiatric:        Mood and Affect: Mood and affect normal.     Recent Labs     Component Value Date/Time   NA 141 08/08/2022 1207   NA 142 07/10/2012 1058   K 4.3 08/08/2022 1207   K 3.6 07/10/2012 1058   CL 105 08/08/2022 1207   CL 111 (H) 07/10/2012 1058   CO2 23 08/08/2022 1207   CO2 27 07/10/2012 1058   GLUCOSE 77 08/08/2022 1207   GLUCOSE 114 (H) 02/21/2020 1101   GLUCOSE 94 07/10/2012 1058   BUN 10 08/08/2022 1207   BUN 8 07/10/2012 1058   CREATININE 0.78 08/08/2022 1207   CREATININE 0.63 07/10/2012 1058   CALCIUM 9.4 08/08/2022 1207   CALCIUM 8.7 07/10/2012 1058   PROT 6.8 08/08/2022 1207   ALBUMIN 4.4 08/08/2022 1207   AST 21 08/08/2022 1207   ALT 21 08/08/2022 1207   ALKPHOS 88 08/08/2022 1207   BILITOT 0.4 08/08/2022 1207   GFRNONAA >60 02/21/2020 1101   GFRNONAA >60 07/10/2012 1058   GFRAA >60 06/21/2019 1657   GFRAA >60 07/10/2012 1058    Lab Results  Component Value Date   WBC 5.9 08/08/2022   HGB 11.7 08/08/2022   HCT 35.9 08/08/2022   MCV 88 08/08/2022   PLT 270 08/08/2022   No results found for: "HGBA1C" Lab Results  Component Value Date   CHOL 190 08/08/2022   HDL 63 08/08/2022   LDLCALC 104 (H) 08/08/2022   TRIG 134 08/08/2022   CHOLHDL 3.0 08/08/2022   Lab Results  Component Value Date   TSH 1.010 08/08/2022     Assessment and Plan:  1. Mild hyperlipidemia Repeat nonfasting lipids along with LFTs today.  Continue low-dose atorvastatin for ASCVD reduction. - Hepatic function panel - Lipid panel  2. Gastroesophageal reflux  disease, unspecified whether esophagitis present Reviewed omeprazole dose of 40 mg daily is a bit high for long-term use.  Patient also has some 20 mg capsules at home which I would like her to try for at least 5 days and see if she can maintain at the lower dose.  If so, I am happy to refill prescription for omeprazole 20 mg.  Patient to let me know.  3. Paroxysmal SVT (supraventricular tachycardia) (HCC) Seems to have resolved per patient report, no longer taking verapamil, will remove from medication list today.  Will follow clinically  4. Need for COVID-19 vaccine Requests COVID booster today, administered. - Pfizer Comirnaty Covid -[id number] Vaccine 59yrs and older  Patient reminded she is due for other immunizations including Tdap, Prevnar 20, and RSV.  She may consider for the near future, but would like to hold off on these immunizations at this time due to proximity to holiday.  Return in about 6 months (around 08/07/2023) for OV f/u chronic conditions.    Alvester Morin, PA-C, DMSc, Nutritionist [facility] [phone]

## 2023-02-06 NOTE — Patient Instructions (Signed)

## 2023-02-07 LAB — HEPATIC FUNCTION PANEL
ALT: 26 [IU]/L (ref 0–32)
AST: 29 [IU]/L (ref 0–40)
Albumin: 4.5 g/dL (ref 3.9–4.9)
Alkaline Phosphatase: 94 [IU]/L (ref 44–121)
Bilirubin Total: 0.7 mg/dL (ref 0.0–1.2)
Bilirubin, Direct: 0.18 mg/dL (ref 0.00–0.40)
Total Protein: 6.8 g/dL (ref 6.0–8.5)

## 2023-02-07 LAB — LIPID PANEL
Chol/HDL Ratio: 2.8 ratio (ref 0.0–4.4)
Cholesterol, Total: 136 mg/dL (ref 100–199)
HDL: 49 mg/dL (ref >39)
LDL Chol Calc (NIH): 58 mg/dL (ref 0–99)
Triglycerides: 172 mg/dL — ABNORMAL HIGH (ref 0–149)
VLDL Cholesterol Cal: 29 mg/dL (ref 5–40)

## 2023-02-09 ENCOUNTER — Ambulatory Visit: Payer: No Typology Code available for payment source | Admitting: Physician Assistant

## 2023-02-24 DIAGNOSIS — E785 Hyperlipidemia, unspecified: Secondary | ICD-10-CM | POA: Diagnosis not present

## 2023-02-24 DIAGNOSIS — F3341 Major depressive disorder, recurrent, in partial remission: Secondary | ICD-10-CM | POA: Diagnosis not present

## 2023-02-24 DIAGNOSIS — F411 Generalized anxiety disorder: Secondary | ICD-10-CM | POA: Diagnosis not present

## 2023-02-24 DIAGNOSIS — Z6826 Body mass index (BMI) 26.0-26.9, adult: Secondary | ICD-10-CM | POA: Diagnosis not present

## 2023-02-24 DIAGNOSIS — F17211 Nicotine dependence, cigarettes, in remission: Secondary | ICD-10-CM | POA: Diagnosis not present

## 2023-02-24 DIAGNOSIS — E663 Overweight: Secondary | ICD-10-CM | POA: Diagnosis not present

## 2023-02-24 DIAGNOSIS — Z008 Encounter for other general examination: Secondary | ICD-10-CM | POA: Diagnosis not present

## 2023-08-03 ENCOUNTER — Other Ambulatory Visit: Payer: Self-pay | Admitting: Physician Assistant

## 2023-08-03 NOTE — Telephone Encounter (Signed)
 Copied from CRM [id number]. Topic: Clinical - Medication Refill >> Aug 03, 2023  4:01 PM Star [place] wrote: Most Recent Primary Care Visit:  Provider: Leopoldo Rancher  Department: [facility]  Visit Type: OFFICE VISIT  Date: 02/06/2023  Medication: simvastatin  (ZOCOR ) 10 MG tablet-   100 day supply  Has the patient contacted their pharmacy? No (Agent: If no, request that the patient contact the pharmacy for the refill. If patient does not wish to contact the pharmacy document the reason why and proceed with request.) (Agent: If yes, when and what did the pharmacy advise?)  Is this the correct pharmacy for this prescription? Yes If no, delete pharmacy and type the correct one.  This is the patient's preferred pharmacy:  [facility] [id number] [place], [place] - [address] [place] [address] Phone: [phone] Fax: [phone]    Has the prescription been filled recently? Yes  Is the patient out of the medication? Yes  Has the patient been seen for an appointment in the last year OR does the patient have an upcoming appointment? Yes  Can we respond through MyChart? No  Agent: Please be advised that Rx refills may take up to 3 business days. We ask that you follow-up with your pharmacy.

## 2023-08-04 MED ORDER — SIMVASTATIN 10 MG PO TABS: 10.0000 mg | ORAL_TABLET | Freq: Every day | ORAL | 0 refills | Status: DC

## 2023-08-04 NOTE — Telephone Encounter (Signed)
 Requested Prescriptions  Pending Prescriptions Disp Refills   simvastatin  (ZOCOR ) 10 MG tablet 100 tablet 0    Sig: Take 1 tablet (10 mg total) by mouth at bedtime.     Cardiovascular:  Antilipid - Statins Failed - 08/04/2023  2:31 PM      Failed - Valid encounter within last 12 months    Recent Outpatient Visits   None            Failed - Lipid Panel in normal range within the last 12 months    Cholesterol, Total  Date Value Ref Range Status  02/06/2023 136 100 - 199 mg/dL Final   LDL Chol Calc (NIH)  Date Value Ref Range Status  02/06/2023 58 0 - 99 mg/dL Final   HDL  Date Value Ref Range Status  02/06/2023 49 >39 mg/dL Final   Triglycerides  Date Value Ref Range Status  02/06/2023 172 (H) 0 - 149 mg/dL Final         Passed - Patient is not pregnant

## 2023-08-06 ENCOUNTER — Other Ambulatory Visit: Payer: Self-pay | Admitting: Physician Assistant

## 2023-08-07 NOTE — Telephone Encounter (Signed)
 Duplicate request, refilled 08/04/23.  Requested Prescriptions  Pending Prescriptions Disp Refills   simvastatin  (ZOCOR ) 10 MG tablet [Pharmacy Med Name: SIMVASTATIN  10 MG TABLET] 90 tablet 0    Sig: TAKE 1 TABLET BY MOUTH AT BEDTIME     Cardiovascular:  Antilipid - Statins Failed - 08/07/2023  8:45 AM      Failed - Valid encounter within last 12 months    Recent Outpatient Visits   None            Failed - Lipid Panel in normal range within the last 12 months    Cholesterol, Total  Date Value Ref Range Status  02/06/2023 136 100 - 199 mg/dL Final   LDL Chol Calc (NIH)  Date Value Ref Range Status  02/06/2023 58 0 - 99 mg/dL Final   HDL  Date Value Ref Range Status  02/06/2023 49 >39 mg/dL Final   Triglycerides  Date Value Ref Range Status  02/06/2023 172 (H) 0 - 149 mg/dL Final         Passed - Patient is not pregnant

## 2023-08-18 DIAGNOSIS — S01112A Laceration without foreign body of left eyelid and periocular area, initial encounter: Secondary | ICD-10-CM | POA: Diagnosis not present

## 2023-08-18 DIAGNOSIS — R519 Headache, unspecified: Secondary | ICD-10-CM | POA: Diagnosis not present

## 2023-08-26 DIAGNOSIS — Z4802 Encounter for removal of sutures: Secondary | ICD-10-CM | POA: Diagnosis not present

## 2023-09-27 ENCOUNTER — Other Ambulatory Visit: Payer: Self-pay | Admitting: Physician Assistant

## 2023-09-27 DIAGNOSIS — F419 Anxiety disorder, unspecified: Secondary | ICD-10-CM

## 2023-09-29 NOTE — Telephone Encounter (Signed)
 Patient called, left VM to return the call to the office to schedule an OV for follow up. Sent MyChart message to call and schedule.

## 2023-09-29 NOTE — Telephone Encounter (Signed)
 Courtesy refill given, appointment needed.    Requested Prescriptions  Pending Prescriptions Disp Refills   venlafaxine  XR (EFFEXOR -XR) 37.5 MG 24 hr capsule [Pharmacy Med Name: VENLAFAXINE  HCL ER 37.5 MG CAP] 30 capsule 0    Sig: Take 1 capsule (37.5 mg total) by mouth daily. OFFICE VISIT NEEDED FOR ADDITIONAL REFILLS     Psychiatry: Antidepressants - SNRI - desvenlafaxine & venlafaxine  Failed - 09/29/2023  2:05 PM      Failed - Cr in normal range and within 360 days    Creatinine  Date Value Ref Range Status  07/10/2012 0.63 0.60 - 1.30 mg/dL Final   Creatinine, Ser  Date Value Ref Range Status  08/08/2022 0.78 0.57 - 1.00 mg/dL Final         Failed - Valid encounter within last 6 months    Recent Outpatient Visits   None            Failed - Lipid Panel in normal range within the last 12 months    Cholesterol, Total  Date Value Ref Range Status  02/06/2023 136 100 - 199 mg/dL Final   LDL Chol Calc (NIH)  Date Value Ref Range Status  02/06/2023 58 0 - 99 mg/dL Final   HDL  Date Value Ref Range Status  02/06/2023 49 >39 mg/dL Final   Triglycerides  Date Value Ref Range Status  02/06/2023 172 (H) 0 - 149 mg/dL Final         Passed - Last BP in normal range    BP Readings from Last 1 Encounters:  02/06/23 130/74

## 2023-11-04 ENCOUNTER — Other Ambulatory Visit: Payer: Self-pay | Admitting: Physician Assistant

## 2023-11-04 DIAGNOSIS — F419 Anxiety disorder, unspecified: Secondary | ICD-10-CM

## 2023-11-05 NOTE — Telephone Encounter (Signed)
 Requested medications are due for refill today.  yes  Requested medications are on the active medications list.  yes  Last refill. 09/29/2023 #30 0 rf  Future visit scheduled.   yes  Notes to clinic.  Labs are expired.    Requested Prescriptions  Pending Prescriptions Disp Refills   venlafaxine  XR (EFFEXOR -XR) 37.5 MG 24 hr capsule [Pharmacy Med Name: VENLAFAXINE  HCL ER 37.5 MG CAP] 30 capsule 0    Sig: TAKE 1 CAPSULE BY MOUTH DAILY *OFFICE VISIT NEEDED FOR FURTHER REFILLS*     Psychiatry: Antidepressants - SNRI - desvenlafaxine & venlafaxine  Failed - 11/05/2023  4:28 PM      Failed - Cr in normal range and within 360 days    Creatinine  Date Value Ref Range Status  07/10/2012 0.63 0.60 - 1.30 mg/dL Final   Creatinine, Ser  Date Value Ref Range Status  08/08/2022 0.78 0.57 - 1.00 mg/dL Final         Failed - Valid encounter within last 6 months    Recent Outpatient Visits   None            Failed - Lipid Panel in normal range within the last 12 months    Cholesterol, Total  Date Value Ref Range Status  02/06/2023 136 100 - 199 mg/dL Final   LDL Chol Calc (NIH)  Date Value Ref Range Status  02/06/2023 58 0 - 99 mg/dL Final   HDL  Date Value Ref Range Status  02/06/2023 49 >39 mg/dL Final   Triglycerides  Date Value Ref Range Status  02/06/2023 172 (H) 0 - 149 mg/dL Final         Passed - Last BP in normal range    BP Readings from Last 1 Encounters:  02/06/23 130/74

## 2023-11-15 ENCOUNTER — Other Ambulatory Visit: Payer: Self-pay | Admitting: Physician Assistant

## 2023-11-16 ENCOUNTER — Ambulatory Visit (INDEPENDENT_AMBULATORY_CARE_PROVIDER_SITE_OTHER): Admitting: Physician Assistant

## 2023-11-16 ENCOUNTER — Encounter: Payer: Self-pay | Admitting: Physician Assistant

## 2023-11-16 VITALS — BP 160/88 | HR 76 | Temp 98.2°F | Ht 66.0 in | Wt 171.0 lb

## 2023-11-16 DIAGNOSIS — R030 Elevated blood-pressure reading, without diagnosis of hypertension: Secondary | ICD-10-CM | POA: Insufficient documentation

## 2023-11-16 DIAGNOSIS — K219 Gastro-esophageal reflux disease without esophagitis: Secondary | ICD-10-CM | POA: Diagnosis not present

## 2023-11-16 DIAGNOSIS — R5383 Other fatigue: Secondary | ICD-10-CM | POA: Diagnosis not present

## 2023-11-16 DIAGNOSIS — Z79899 Other long term (current) drug therapy: Secondary | ICD-10-CM | POA: Diagnosis not present

## 2023-11-16 DIAGNOSIS — Z8639 Personal history of other endocrine, nutritional and metabolic disease: Secondary | ICD-10-CM

## 2023-11-16 DIAGNOSIS — E785 Hyperlipidemia, unspecified: Secondary | ICD-10-CM | POA: Diagnosis not present

## 2023-11-16 DIAGNOSIS — E559 Vitamin D deficiency, unspecified: Secondary | ICD-10-CM | POA: Diagnosis not present

## 2023-11-16 DIAGNOSIS — F419 Anxiety disorder, unspecified: Secondary | ICD-10-CM

## 2023-11-16 DIAGNOSIS — M8580 Other specified disorders of bone density and structure, unspecified site: Secondary | ICD-10-CM | POA: Diagnosis not present

## 2023-11-16 DIAGNOSIS — I10 Essential (primary) hypertension: Secondary | ICD-10-CM | POA: Insufficient documentation

## 2023-11-16 DIAGNOSIS — Z780 Asymptomatic menopausal state: Secondary | ICD-10-CM | POA: Diagnosis not present

## 2023-11-16 HISTORY — DX: Other long term (current) drug therapy: Z79.899

## 2023-11-16 MED ORDER — CHOLECALCIFEROL 1.25 MG (50000 UT) PO CAPS
50000.0000 [IU] | ORAL_CAPSULE | ORAL | 0 refills | Status: AC
Start: 2023-11-16 — End: 2024-02-02

## 2023-11-16 MED ORDER — VENLAFAXINE HCL ER 37.5 MG PO CP24
37.5000 mg | ORAL_CAPSULE | Freq: Every day | ORAL | 1 refills | Status: DC
Start: 2023-11-16 — End: 2023-12-25

## 2023-11-16 MED ORDER — SIMVASTATIN 10 MG PO TABS: 10.0000 mg | ORAL_TABLET | Freq: Every day | ORAL | 1 refills | Status: DC

## 2023-11-16 MED ORDER — OMEPRAZOLE 20 MG PO CPDR
20.0000 mg | DELAYED_RELEASE_CAPSULE | Freq: Every day | ORAL | 1 refills | Status: DC
Start: 2023-11-16 — End: 2023-12-25

## 2023-11-16 NOTE — Assessment & Plan Note (Signed)
 Discussed potential for long-term PPI use to increase risk for osteoporosis, B12 deficiency, among others.  Last year she was able to reduce from 40 mg daily to 20 mg daily.  Encouraged her to attempt further reduction to 20 mg every other day.  She will try this.

## 2023-11-16 NOTE — Assessment & Plan Note (Signed)
 Check vitamin D  level today, restart high-dose vitamin D  once weekly as below

## 2023-11-16 NOTE — Assessment & Plan Note (Signed)
Refill venlafaxine.

## 2023-11-16 NOTE — Progress Notes (Signed)
 Date:  11/16/2023   Name:  Shelley White   DOB:  07/29/1955   MRN:  [id number]   Chief Complaint: Hyperlipidemia and Anxiety  HPI Shelley White is a delightful 68 y.o. female who returns overdue for her 75-month follow-up on chronic conditions, overall doing quite well.  Last visit we started low-dose simvastatin  to lower her ASCVD risk given her smoking history; LDL was actually quite mild at 104.  She continues to tolerate the medication well and reports good compliance, no side effects.    She has been feeling quite fatigued for the last 6 months or so.  History of osteopenia, not currently supplementing with any vitamin D .  Also asks if it could be her B12 levels.  Wants to check some routine labs today.  Medication list has been reviewed and updated.  Current Meds  Medication Sig   aspirin  EC 81 MG tablet Take 81 mg by mouth daily. Swallow whole.   azelastine  (ASTELIN ) 0.1 % nasal spray Place 2 sprays into both nostrils 2 (two) times daily. Use in each nostril as directed   buPROPion  (WELLBUTRIN  XL) 150 MG 24 hr tablet Take 1 tablet (150 mg total) by mouth daily.   cetirizine (ZYRTEC) 10 MG tablet Take 10 mg by mouth at bedtime.    Cholecalciferol  1.25 MG (50000 UT) capsule Take 1 capsule (50,000 Units total) by mouth once a week for 12 doses.   hydrOXYzine  (ATARAX ) 25 MG tablet Take 1 tablet (25 mg total) by mouth every 8 (eight) hours as needed for anxiety.   Omega-3 Fatty Acids (OMEGA 3 500) 500 MG CAPS Take 500 mg by mouth daily.   zinc gluconate 50 MG tablet Take 50 mg by mouth daily.   [DISCONTINUED] omeprazole  (PRILOSEC) 40 MG capsule Take 1 capsule (40 mg total) by mouth daily. (Patient taking differently: Take 20 mg by mouth daily.)   [DISCONTINUED] simvastatin  (ZOCOR ) 10 MG tablet Take 1 tablet (10 mg total) by mouth at bedtime.   [DISCONTINUED] venlafaxine  XR (EFFEXOR -XR) 37.5 MG 24 hr capsule TAKE 1 CAPSULE BY MOUTH DAILY *OFFICE VISIT NEEDED FOR FURTHER REFILLS*    [DISCONTINUED] VITAMIN D , CHOLECALCIFEROL , PO 1 tablet daily.     Review of Systems  Patient Active Problem List   Diagnosis Date Noted   Long-term current use of proton pump inhibitor therapy 11/16/2023   Borderline hypertension 11/16/2023   Mild hyperlipidemia 09/15/2022   Paroxysmal SVT (supraventricular tachycardia) (HCC) 09/10/2022   Gastroesophageal reflux disease 08/08/2022   History of vitamin D  deficiency 08/08/2022   Osteopenia after menopause 08/08/2022   Diverticulosis 07/10/2022   Former smoker 07/10/2022   Mild anxiety 07/10/2022   Palpitations 07/10/2022   Encounter for screening colonoscopy    Polyp of colon    Hematuria 01/03/2019   Chest pain 12/08/2017    Allergies  Allergen Reactions   Codeine Itching and Swelling    Immunization History  Administered Date(s) Administered   PFIZER(Purple Top)SARS-COV-2 Vaccination 06/13/2019, 07/10/2019   Pfizer(Comirnaty)Fall Seasonal Vaccine 12 years and older 02/06/2023   Zoster Recombinant(Shingrix ) 02/26/2022, 08/08/2022    Past Surgical History:  Procedure Laterality Date   ABDOMINAL HYSTERECTOMY     BREAST BIOPSY Right    ADH   BREAST LUMPECTOMY Right 2007   ADH   COLONOSCOPY WITH PROPOFOL  N/A 11/18/2019   Procedure: COLONOSCOPY WITH PROPOFOL ;  Surgeon: Janalyn Keene NOVAK, MD;  Location: [facility];  Service: Endoscopy;  Laterality: N/A;   CYSTOSCOPY WITH BIOPSY N/A 07/08/2019   Procedure: CYSTOSCOPY  WITH  BIOPSY;  Surgeon: Francisca Redell BROCKS, MD;  Location: [facility] ORS;  Service: Urology;  Laterality: N/A;   TONSILLECTOMY     age 59   URETEROSCOPY Bilateral 07/08/2019   Procedure: DIAGNOSTIC URETEROSCOPY;  Surgeon: Francisca Redell BROCKS, MD;  Location: [facility] ORS;  Service: Urology;  Laterality: Bilateral;    Social History   Tobacco Use   Smoking status: Former    Current packs/day: 0.00    Average packs/day: 1 pack/day for 30.0 years (30.0 ttl pk-yrs)    Types: Cigarettes    Start date: 06/12/1992     Quit date: 06/13/2022    Years since quitting: 1.4   Smokeless tobacco: Never  Vaping Use   Vaping status: Never Used  Substance Use Topics   Alcohol use: No   Drug use: No    Family History  Problem Relation Age of Onset   Stroke Mother    Hypertension Mother    Diabetes Mother    Cancer Father    Colon cancer Father    Heart attack Brother    Breast cancer Neg Hx         11/16/2023    1:23 PM 02/06/2023    1:20 PM 08/08/2022   10:50 AM 07/10/2022    3:21 PM  GAD 7 : Generalized Anxiety Score  Nervous, Anxious, on Edge 1 0 1 1  Control/stop worrying 0 1 1 1   Worry too much - different things 1 1 1 1   Trouble relaxing 1 1 1 2   Restless 1 1 1 2   Easily annoyed or irritable 1 1 1 1   Afraid - awful might happen 0 1 1 0  Total GAD 7 Score 5 6 7 8   Anxiety Difficulty Not difficult at all Not difficult at all Not difficult at all Somewhat difficult       11/16/2023    1:23 PM 02/06/2023    1:20 PM 08/08/2022   10:59 AM  Depression screen PHQ 2/9  Decreased Interest 0 1 1  Down, Depressed, Hopeless 0 0 1  PHQ - 2 Score 0 1 2  Altered sleeping 1 1 1   Tired, decreased energy 3 2 0  Change in appetite 1 0 0  Feeling bad or failure about yourself  0 0 0  Trouble concentrating 2 1 1   Moving slowly or fidgety/restless 1 0 1  Suicidal thoughts 0 0 0  PHQ-9 Score 8 5 5   Difficult doing work/chores Not difficult at all Not difficult at all Somewhat difficult    BP Readings from Last 3 Encounters:  11/16/23 (!) 160/88  02/06/23 130/74  01/01/23 128/68    Wt Readings from Last 3 Encounters:  11/16/23 171 lb (77.6 kg)  02/06/23 162 lb (73.5 kg)  09/23/22 163 lb 4.8 oz (74.1 kg)    BP (!) 160/88 (Cuff Size: Normal)   Pulse 76   Temp 98.2 F (36.8 C)   Ht 5' 6 (1.676 m)   Wt 171 lb (77.6 kg)   SpO2 96%   BMI 27.60 kg/m   Physical Exam Vitals and nursing note reviewed.  Constitutional:      Appearance: Normal appearance.  Cardiovascular:     Rate and Rhythm:  Normal rate and regular rhythm.     Heart sounds: No murmur heard.    No friction rub. No gallop.  Pulmonary:     Effort: Pulmonary effort is normal.     Breath sounds: Normal breath sounds.  Abdominal:  General: There is no distension.  Musculoskeletal:        General: Normal range of motion.  Skin:    General: Skin is warm and dry.  Neurological:     Mental Status: She is alert and oriented to person, place, and time.     Gait: Gait is intact.  Psychiatric:        Mood and Affect: Mood and affect normal.     Recent Labs     Component Value Date/Time   NA 141 08/08/2022 1207   NA 142 07/10/2012 1058   K 4.3 08/08/2022 1207   K 3.6 07/10/2012 1058   CL 105 08/08/2022 1207   CL 111 (H) 07/10/2012 1058   CO2 23 08/08/2022 1207   CO2 27 07/10/2012 1058   GLUCOSE 77 08/08/2022 1207   GLUCOSE 114 (H) 02/21/2020 1101   GLUCOSE 94 07/10/2012 1058   BUN 10 08/08/2022 1207   BUN 8 07/10/2012 1058   CREATININE 0.78 08/08/2022 1207   CREATININE 0.63 07/10/2012 1058   CALCIUM 9.4 08/08/2022 1207   CALCIUM 8.7 07/10/2012 1058   PROT 6.8 02/06/2023 1413   ALBUMIN 4.5 02/06/2023 1413   AST 29 02/06/2023 1413   ALT 26 02/06/2023 1413   ALKPHOS 94 02/06/2023 1413   BILITOT 0.7 02/06/2023 1413   GFRNONAA >60 02/21/2020 1101   GFRNONAA >60 07/10/2012 1058   GFRAA >60 06/21/2019 1657   GFRAA >60 07/10/2012 1058    Lab Results  Component Value Date   WBC 5.9 08/08/2022   HGB 11.7 08/08/2022   HCT 35.9 08/08/2022   MCV 88 08/08/2022   PLT 270 08/08/2022   No results found for: HGBA1C Lab Results  Component Value Date   CHOL 136 02/06/2023   HDL 49 02/06/2023   LDLCALC 58 02/06/2023   TRIG 172 (H) 02/06/2023   CHOLHDL 2.8 02/06/2023   Lab Results  Component Value Date   TSH 1.010 08/08/2022      Assessment and Plan:  Gastroesophageal reflux disease, unspecified whether esophagitis present Assessment & Plan: Discussed potential for long-term PPI use to  increase risk for osteoporosis, B12 deficiency, among others.  Last year she was able to reduce from 40 mg daily to 20 mg daily.  Encouraged her to attempt further reduction to 20 mg every other day.  She will try this.  Orders: -     Omeprazole ; Take 1 capsule (20 mg total) by mouth daily.  Dispense: 90 capsule; Refill: 1  Long-term current use of proton pump inhibitor therapy Assessment & Plan: Discussed potential for long-term PPI use to increase risk for osteoporosis, B12 deficiency, among others.  Last year she was able to reduce from 40 mg daily to 20 mg daily.  Encouraged her to attempt further reduction to 20 mg every other day.  She will try this.  Orders: -     VITAMIN D  25 Hydroxy (Vit-D Deficiency, Fractures) -     B12 and Folate Panel  Osteopenia after menopause Assessment & Plan: Check vitamin D  level today, restart high-dose vitamin D  once weekly as below  Orders: -     Comprehensive metabolic panel with GFR -     VITAMIN D  25 Hydroxy (Vit-D Deficiency, Fractures) -     Cholecalciferol ; Take 1 capsule (50,000 Units total) by mouth once a week for 12 doses.  Dispense: 12 capsule; Refill: 0  History of vitamin D  deficiency -     VITAMIN D  25 Hydroxy (Vit-D Deficiency, Fractures) -  Cholecalciferol ; Take 1 capsule (50,000 Units total) by mouth once a week for 12 doses.  Dispense: 12 capsule; Refill: 0  Borderline hypertension Assessment & Plan: Elevated x 2 today, which is unusual for her.  Advised home blood pressure monitoring with close follow-up on this at next physical   Mild hyperlipidemia Assessment & Plan: Continue simvastatin  for cardiovascular risk reduction, repeat lipids  Orders: -     Lipid panel -     Simvastatin ; Take 1 tablet (10 mg total) by mouth at bedtime.  Dispense: 90 tablet; Refill: 1  Mild anxiety Assessment & Plan: Refill venlafaxine   Orders: -     Venlafaxine  HCl ER; Take 1 capsule (37.5 mg total) by mouth daily.  Dispense: 90  capsule; Refill: 1  Fatigue, unspecified type -     CBC with Differential/Platelet -     Comprehensive metabolic panel with GFR -     TSH -     VITAMIN D  25 Hydroxy (Vit-D Deficiency, Fractures) -     B12 and Folate Panel     Return in about 4 weeks (around 12/14/2023) for CPE.    Rolan Hoyle, PA-C, DMSc, Nutritionist [facility] Primary Care and Sports [facility] [phone]

## 2023-11-16 NOTE — Assessment & Plan Note (Signed)
 Elevated x 2 today, which is unusual for her.  Advised home blood pressure monitoring with close follow-up on this at next physical

## 2023-11-16 NOTE — Assessment & Plan Note (Signed)
 Continue simvastatin  for cardiovascular risk reduction, repeat lipids

## 2023-11-17 ENCOUNTER — Ambulatory Visit: Payer: Self-pay | Admitting: Physician Assistant

## 2023-11-17 LAB — CBC WITH DIFFERENTIAL/PLATELET
Basophils Absolute: 0 x10E3/uL (ref 0.0–0.2)
Basos: 1 %
EOS (ABSOLUTE): 0.3 x10E3/uL (ref 0.0–0.4)
Eos: 4 %
Hematocrit: 37.8 % (ref 34.0–46.6)
Hemoglobin: 11.8 g/dL (ref 11.1–15.9)
Immature Grans (Abs): 0 x10E3/uL (ref 0.0–0.1)
Immature Granulocytes: 0 %
Lymphocytes Absolute: 2.4 x10E3/uL (ref 0.7–3.1)
Lymphs: 40 %
MCH: 27.5 pg (ref 26.6–33.0)
MCHC: 31.2 g/dL — ABNORMAL LOW (ref 31.5–35.7)
MCV: 88 fL (ref 79–97)
Monocytes Absolute: 0.4 x10E3/uL (ref 0.1–0.9)
Monocytes: 7 %
Neutrophils Absolute: 2.8 x10E3/uL (ref 1.4–7.0)
Neutrophils: 48 %
Platelets: 277 x10E3/uL (ref 150–450)
RBC: 4.29 x10E6/uL (ref 3.77–5.28)
RDW: 14 % (ref 11.7–15.4)
WBC: 5.9 x10E3/uL (ref 3.4–10.8)

## 2023-11-17 LAB — COMPREHENSIVE METABOLIC PANEL WITH GFR
ALT: 13 IU/L (ref 0–32)
AST: 19 IU/L (ref 0–40)
Albumin: 4.4 g/dL (ref 3.9–4.9)
Alkaline Phosphatase: 95 IU/L (ref 44–121)
BUN/Creatinine Ratio: 12 (ref 12–28)
BUN: 10 mg/dL (ref 8–27)
Bilirubin Total: 0.7 mg/dL (ref 0.0–1.2)
CO2: 22 mmol/L (ref 20–29)
Calcium: 9 mg/dL (ref 8.7–10.3)
Chloride: 106 mmol/L (ref 96–106)
Creatinine, Ser: 0.83 mg/dL (ref 0.57–1.00)
Globulin, Total: 2.5 g/dL (ref 1.5–4.5)
Glucose: 80 mg/dL (ref 70–99)
Potassium: 4.2 mmol/L (ref 3.5–5.2)
Sodium: 142 mmol/L (ref 134–144)
Total Protein: 6.9 g/dL (ref 6.0–8.5)
eGFR: 76 mL/min/1.73 (ref >59)

## 2023-11-17 LAB — LIPID PANEL
Chol/HDL Ratio: 2.7 ratio (ref 0.0–4.4)
Cholesterol, Total: 143 mg/dL (ref 100–199)
HDL: 53 mg/dL (ref >39)
LDL Chol Calc (NIH): 68 mg/dL (ref 0–99)
Triglycerides: 124 mg/dL (ref 0–149)
VLDL Cholesterol Cal: 22 mg/dL (ref 5–40)

## 2023-11-17 LAB — B12 AND FOLATE PANEL
Folate: >20 ng/mL (ref >3.0)
Vitamin B-12: 506 pg/mL (ref 232–1245)

## 2023-11-17 LAB — VITAMIN D 25 HYDROXY (VIT D DEFICIENCY, FRACTURES): Vit D, 25-Hydroxy: 36.2 ng/mL (ref 30.0–100.0)

## 2023-11-17 LAB — TSH: TSH: 0.941 u[IU]/mL (ref 0.450–4.500)

## 2023-11-21 ENCOUNTER — Other Ambulatory Visit: Payer: Self-pay | Admitting: Physician Assistant

## 2023-11-21 DIAGNOSIS — F419 Anxiety disorder, unspecified: Secondary | ICD-10-CM

## 2023-11-24 NOTE — Telephone Encounter (Signed)
 Refused Effexor  -XR because this is a duplicate request.   Sent in on 11/16/2023 #90, 1 refill.

## 2023-12-04 DIAGNOSIS — H2513 Age-related nuclear cataract, bilateral: Secondary | ICD-10-CM | POA: Diagnosis not present

## 2023-12-21 ENCOUNTER — Encounter: Admitting: Physician Assistant

## 2023-12-24 ENCOUNTER — Telehealth: Payer: Self-pay | Admitting: Physician Assistant

## 2023-12-24 NOTE — Telephone Encounter (Signed)
 Copied from CRM [id number]. Topic: Medicare AWV >> Dec 24, 2023 11:57 AM Nathanel DEL wrote: Reason for CRM: Called LVM 12/24/2023 to schedule AWV. Please schedule Virtual or Telehealth visits ONLY.   Nathanel Paschal; Care Guide Ambulatory Clinical Support [place] [facility] Direct Dial: [phone]

## 2023-12-25 ENCOUNTER — Encounter: Payer: Self-pay | Admitting: Physician Assistant

## 2023-12-25 ENCOUNTER — Ambulatory Visit (INDEPENDENT_AMBULATORY_CARE_PROVIDER_SITE_OTHER): Admitting: Physician Assistant

## 2023-12-25 VITALS — BP 130/78 | HR 83 | Temp 99.1°F | Ht 66.0 in | Wt 170.0 lb

## 2023-12-25 DIAGNOSIS — Z780 Asymptomatic menopausal state: Secondary | ICD-10-CM

## 2023-12-25 DIAGNOSIS — I10 Essential (primary) hypertension: Secondary | ICD-10-CM

## 2023-12-25 DIAGNOSIS — Z122 Encounter for screening for malignant neoplasm of respiratory organs: Secondary | ICD-10-CM | POA: Diagnosis not present

## 2023-12-25 DIAGNOSIS — K219 Gastro-esophageal reflux disease without esophagitis: Secondary | ICD-10-CM

## 2023-12-25 DIAGNOSIS — Z0000 Encounter for general adult medical examination without abnormal findings: Secondary | ICD-10-CM

## 2023-12-25 DIAGNOSIS — R351 Nocturia: Secondary | ICD-10-CM | POA: Diagnosis not present

## 2023-12-25 DIAGNOSIS — Z2821 Immunization not carried out because of patient refusal: Secondary | ICD-10-CM

## 2023-12-25 DIAGNOSIS — M8580 Other specified disorders of bone density and structure, unspecified site: Secondary | ICD-10-CM | POA: Diagnosis not present

## 2023-12-25 DIAGNOSIS — F419 Anxiety disorder, unspecified: Secondary | ICD-10-CM

## 2023-12-25 DIAGNOSIS — Z1231 Encounter for screening mammogram for malignant neoplasm of breast: Secondary | ICD-10-CM

## 2023-12-25 DIAGNOSIS — R232 Flushing: Secondary | ICD-10-CM

## 2023-12-25 MED ORDER — VENLAFAXINE HCL ER 37.5 MG PO CP24: 37.5000 mg | ORAL_CAPSULE | ORAL | Status: DC

## 2023-12-25 MED ORDER — OMEPRAZOLE 20 MG PO CPDR: 20.0000 mg | DELAYED_RELEASE_CAPSULE | ORAL | Status: DC | PRN

## 2023-12-25 MED ORDER — PROPRANOLOL HCL 20 MG PO TABS: 20.0000 mg | ORAL_TABLET | Freq: Two times a day (BID) | ORAL | 1 refills | Status: DC

## 2023-12-25 MED ORDER — HYDROXYZINE HCL 25 MG PO TABS: 12.5000 mg | ORAL_TABLET | Freq: Every evening | ORAL | Status: DC

## 2023-12-25 NOTE — Progress Notes (Signed)
 Date:  12/25/2023   Name:  Shelley White   DOB:  12/01/1955   MRN:  [id number]   Chief Complaint: Annual Exam  HPI Raiven presents today for routine annual physical.  She was last seen by me about a month ago for evaluation of fatigue which has been ongoing for the last 6 months; labs from that date were all normal.  She still feels fatigued.  She also endorses hot flashes for the last 10 years which is bothersome to her.  She has never used any kind of hormone replacement.  Last Physical: 08/08/2022 Last CRC screen: colonoscopy 11/18/19 with 1 polyp, diverticulosis, 5y f/u Last LDCT: had coronary CTA 01/01/23 with mild bilateral atelectasis Last Mammo: 10/10/22 normal Last DEXA: >10y ago osteopenia Immunizations Due: Prevnar 20, Tdap, RSV, flu, COVID    Medication list has been reviewed and updated.  Current Meds  Medication Sig   aspirin  EC 81 MG tablet Take 81 mg by mouth daily. Swallow whole.   azelastine  (ASTELIN ) 0.1 % nasal spray Place 2 sprays into both nostrils 2 (two) times daily. Use in each nostril as directed   buPROPion  (WELLBUTRIN  XL) 150 MG 24 hr tablet Take 1 tablet (150 mg total) by mouth daily.   cetirizine (ZYRTEC) 10 MG tablet Take 10 mg by mouth at bedtime.    Cholecalciferol  1.25 MG (50000 UT) capsule Take 1 capsule (50,000 Units total) by mouth once a week for 12 doses.   hydrOXYzine  (ATARAX ) 25 MG tablet Take 1 tablet (25 mg total) by mouth every 8 (eight) hours as needed for anxiety.   Omega-3 Fatty Acids (OMEGA 3 500) 500 MG CAPS Take 500 mg by mouth daily.   propranolol  (INDERAL ) 20 MG tablet Take 1 tablet (20 mg total) by mouth 2 (two) times daily.   simvastatin  (ZOCOR ) 10 MG tablet Take 1 tablet (10 mg total) by mouth at bedtime.   zinc gluconate 50 MG tablet Take 50 mg by mouth daily.   [DISCONTINUED] omeprazole  (PRILOSEC) 20 MG capsule Take 1 capsule (20 mg total) by mouth daily.   [DISCONTINUED] venlafaxine  XR (EFFEXOR -XR) 37.5 MG 24 hr capsule Take  1 capsule (37.5 mg total) by mouth daily.     Review of Systems  Patient Active Problem List   Diagnosis Date Noted   Hypertension 11/16/2023   Mild hyperlipidemia 09/15/2022   Paroxysmal SVT (supraventricular tachycardia) (HCC) 09/10/2022   Gastroesophageal reflux disease 08/08/2022   History of vitamin D  deficiency 08/08/2022   Osteopenia after menopause 08/08/2022   Diverticulosis 07/10/2022   Former smoker 07/10/2022   Mild anxiety 07/10/2022   Palpitations 07/10/2022   Polyp of colon    Hematuria 01/03/2019   Chest pain 12/08/2017    Allergies  Allergen Reactions   Codeine Itching and Swelling    Immunization History  Administered Date(s) Administered   PFIZER(Purple Top)SARS-COV-2 Vaccination 06/13/2019, 07/10/2019   Pfizer(Comirnaty)Fall Seasonal Vaccine 12 years and older 02/06/2023   Zoster Recombinant(Shingrix ) 02/26/2022, 08/08/2022    Past Surgical History:  Procedure Laterality Date   ABDOMINAL HYSTERECTOMY     BREAST BIOPSY Right    ADH   BREAST LUMPECTOMY Right 2007   ADH   COLONOSCOPY WITH PROPOFOL  N/A 11/18/2019   Procedure: COLONOSCOPY WITH PROPOFOL ;  Surgeon: Janalyn Keene NOVAK, MD;  Location: [facility] ENDOSCOPY;  Service: Endoscopy;  Laterality: N/A;   CYSTOSCOPY WITH BIOPSY N/A 07/08/2019   Procedure: CYSTOSCOPY WITH  BIOPSY;  Surgeon: Francisca Redell BROCKS, MD;  Location: [facility] ORS;  Service:  Urology;  Laterality: N/A;   TONSILLECTOMY     age 77   URETEROSCOPY Bilateral 07/08/2019   Procedure: DIAGNOSTIC URETEROSCOPY;  Surgeon: Francisca Redell BROCKS, MD;  Location: [facility] ORS;  Service: Urology;  Laterality: Bilateral;    Social History   Tobacco Use   Smoking status: Former    Current packs/day: 0.00    Average packs/day: 1 pack/day for 30.0 years (30.0 ttl pk-yrs)    Types: Cigarettes    Start date: 06/12/1992    Quit date: 06/13/2022    Years since quitting: 1.5   Smokeless tobacco: Never  Vaping Use   Vaping status: Never Used  Substance Use Topics    Alcohol use: No   Drug use: No    Family History  Problem Relation Age of Onset   Stroke Mother    Hypertension Mother    Diabetes Mother    Cancer Father    Colon cancer Father    Heart attack Brother    Breast cancer Neg Hx         12/25/2023    2:08 PM 11/16/2023    1:23 PM 02/06/2023    1:20 PM 08/08/2022   10:50 AM  GAD 7 : Generalized Anxiety Score  Nervous, Anxious, on Edge 1 1 0 1  Control/stop worrying 1 0 1 1  Worry too much - different things 1 1 1 1   Trouble relaxing 1 1 1 1   Restless 1 1 1 1   Easily annoyed or irritable 1 1 1 1   Afraid - awful might happen 0 0 1 1  Total GAD 7 Score 6 5 6 7   Anxiety Difficulty Not difficult at all Not difficult at all Not difficult at all Not difficult at all       12/25/2023    2:08 PM 11/16/2023    1:23 PM 02/06/2023    1:20 PM  Depression screen PHQ 2/9  Decreased Interest 0 0 1  Down, Depressed, Hopeless 0 0 0  PHQ - 2 Score 0 0 1  Altered sleeping 0 1 1  Tired, decreased energy 0 3 2  Change in appetite 0 1 0  Feeling bad or failure about yourself  0 0 0  Trouble concentrating 0 2 1  Moving slowly or fidgety/restless 0 1 0  Suicidal thoughts 0 0 0  PHQ-9 Score 0 8 5  Difficult doing work/chores Not difficult at all Not difficult at all Not difficult at all    BP Readings from Last 3 Encounters:  12/25/23 130/78  11/16/23 (!) 160/88  02/06/23 130/74    Wt Readings from Last 3 Encounters:  12/25/23 170 lb (77.1 kg)  11/16/23 171 lb (77.6 kg)  02/06/23 162 lb (73.5 kg)    BP 130/78   Pulse 83   Temp 99.1 F (37.3 C)   Ht 5' 6 (1.676 m)   Wt 170 lb (77.1 kg)   SpO2 98%   BMI 27.44 kg/m   Physical Exam Vitals and nursing note reviewed. Exam conducted with a chaperone present Clenton Person, CMA).  Constitutional:      Appearance: Normal appearance. She is well-groomed.  HENT:     Ears:     Comments: EAC clear bilaterally with good view of TM which is without effusion or erythema.     Nose:  Nose normal.     Mouth/Throat:     Mouth: Mucous membranes are moist. No oral lesions.     Dentition: Has dentures.  Pharynx: Uvula midline. No posterior oropharyngeal erythema.     Comments: Upper dentures.  Eyes:     General: Vision grossly intact.     Extraocular Movements: Extraocular movements intact.     Conjunctiva/sclera: Conjunctivae normal.     Pupils: Pupils are equal, round, and reactive to light.  Neck:     Thyroid : No thyroid  mass or thyromegaly.  Cardiovascular:     Rate and Rhythm: Normal rate and regular rhythm.     Heart sounds: S1 normal and S2 normal. No murmur heard.    No friction rub. No gallop.     Comments: Pulses 2+ at radial, PT, DP bilaterally. No carotid bruit. No peripheral edema Pulmonary:     Effort: Pulmonary effort is normal.     Breath sounds: Normal breath sounds.  Chest:     Comments: Breast exam typical for age. No obvious asymmetry or lymphadenopathy.  Bilateral subareolar tenderness with fibrocystic changes appreciated. Abdominal:     General: Bowel sounds are normal.     Palpations: Abdomen is soft. There is no mass.     Tenderness: There is no abdominal tenderness.  Musculoskeletal:     Comments: Full ROM with strength 5/5 bilateral upper and lower extremities  Lymphadenopathy:     Cervical: No cervical adenopathy.  Skin:    General: Skin is warm.     Capillary Refill: Capillary refill takes less than 2 seconds.     Findings: No lesion or rash.  Neurological:     Mental Status: She is alert and oriented to person, place, and time.     Cranial Nerves: Cranial nerves 2-12 are intact.     Gait: Gait is intact.  Psychiatric:        Mood and Affect: Mood and affect normal.        Behavior: Behavior normal.     Recent Labs     Component Value Date/Time   NA 142 11/16/2023 1352   NA 142 07/10/2012 1058   K 4.2 11/16/2023 1352   K 3.6 07/10/2012 1058   CL 106 11/16/2023 1352   CL 111 (H) 07/10/2012 1058   CO2 22 11/16/2023  1352   CO2 27 07/10/2012 1058   GLUCOSE 80 11/16/2023 1352   GLUCOSE 114 (H) 02/21/2020 1101   GLUCOSE 94 07/10/2012 1058   BUN 10 11/16/2023 1352   BUN 8 07/10/2012 1058   CREATININE 0.83 11/16/2023 1352   CREATININE 0.63 07/10/2012 1058   CALCIUM 9.0 11/16/2023 1352   CALCIUM 8.7 07/10/2012 1058   PROT 6.9 11/16/2023 1352   ALBUMIN 4.4 11/16/2023 1352   AST 19 11/16/2023 1352   ALT 13 11/16/2023 1352   ALKPHOS 95 11/16/2023 1352   BILITOT 0.7 11/16/2023 1352   GFRNONAA >60 02/21/2020 1101   GFRNONAA >60 07/10/2012 1058   GFRAA >60 06/21/2019 1657   GFRAA >60 07/10/2012 1058    Lab Results  Component Value Date   WBC 5.9 11/16/2023   HGB 11.8 11/16/2023   HCT 37.8 11/16/2023   MCV 88 11/16/2023   PLT 277 11/16/2023   No results found for: HGBA1C Lab Results  Component Value Date   CHOL 143 11/16/2023   HDL 53 11/16/2023   LDLCALC 68 11/16/2023   TRIG 124 11/16/2023   CHOLHDL 2.7 11/16/2023   Lab Results  Component Value Date   TSH 0.941 11/16/2023      Assessment and Plan:  1. Annual physical exam (Primary) Encouraged healthy lifestyle including regular physical activity  and consumption of whole fruits and vegetables. Encouraged routine dental and eye exams.   2. Screening for lung cancer - Ambulatory Referral Lung Cancer Screening [facility]  3. Osteopenia after menopause Due for repeat DEXA.  Ordered last year but not completed.  Encouraged to complete this time.  Will try to schedule same day as mammogram - DG Bone Density  4. Encounter for screening mammogram for breast cancer - MM 3D SCREENING MAMMOGRAM BILATERAL BREAST  5. Immunization declined Reviewed immunizations due but patient would like to defer.  Advised that we can complete these anytime if she changes her mind, and she can also get these at her local pharmacy.  6. Mild anxiety Symptoms are well-controlled at present.  We had started venlafaxine  in attempt to control her  anxiety as well as hot flashes, but does not seem this has made much of a difference for the latter.  Advised to taper off venlafaxine  over the next 2 weeks, with plan to start propranolol  after that.  I have also advised her to split her nightly hydroxyzine  in half, in case this might be responsible for her fatigue during the day.  - venlafaxine  XR (EFFEXOR -XR) 37.5 MG 24 hr capsule; Take 1 capsule (37.5 mg total) by mouth every other day for 14 days. - hydrOXYzine  (ATARAX ) 25 MG tablet; Take 0.5 tablets (12.5 mg total) by mouth at bedtime.  7. Primary hypertension Will be starting propranolol  after taper off venlafaxine .  Hopefully this medication will give her multiple benefits for her blood pressure, paroxysmal SVT, and anxiety.  - propranolol  (INDERAL ) 20 MG tablet; Take 1 tablet (20 mg total) by mouth 2 (two) times daily.  Dispense: 90 tablet; Refill: 1  8. Nocturia Patient mentions she gets up to urinate 2-3 times per night.  Conservative measures advised including fluid restriction and avoiding evening caffeine/alcohol.  She is to let me know if this worsens.  We reviewed that medication options are available, but she would like to keep her medication list short and I agree with this.  9. Gastroesophageal reflux disease, unspecified whether esophagitis present Patient was successfully able to reduce omeprazole  dosing to 20 mg every other day.  At this point, I would like for her to use this medication only as needed.  She verbalizes understanding. - omeprazole  (PRILOSEC) 20 MG capsule; Take 1 capsule (20 mg total) by mouth as needed.  10. Hot flashes Encouraged to follow-up with GYN, as it is unusual to experience hot flashes so long after menopause.  She might benefit from hormone replacement therapy, especially with her history of osteopenia.     Return in about 6 weeks (around 02/05/2024) for OV f/u propranolol .    Rolan Hoyle, PA-C, DMSc, Nutritionist [facility] Primary Care  and Sports Medicine [facility] [phone]

## 2023-12-31 ENCOUNTER — Ambulatory Visit
Admission: RE | Admit: 2023-12-31 | Discharge: 2023-12-31 | Disposition: A | Discharge status: Home / Self Care | Source: Ambulatory Visit | Attending: Physician Assistant | Admitting: Physician Assistant

## 2023-12-31 ENCOUNTER — Inpatient Hospital Stay
Admission: RE | Admit: 2023-12-31 | Discharge: 2023-12-31 | Discharge status: Home / Self Care | Source: Ambulatory Visit | Attending: Physician Assistant | Admitting: Physician Assistant

## 2023-12-31 ENCOUNTER — Ambulatory Visit: Payer: Self-pay | Admitting: Physician Assistant

## 2023-12-31 DIAGNOSIS — M8580 Other specified disorders of bone density and structure, unspecified site: Secondary | ICD-10-CM | POA: Diagnosis not present

## 2023-12-31 DIAGNOSIS — Z780 Asymptomatic menopausal state: Secondary | ICD-10-CM | POA: Insufficient documentation

## 2023-12-31 DIAGNOSIS — Z1231 Encounter for screening mammogram for malignant neoplasm of breast: Secondary | ICD-10-CM | POA: Insufficient documentation

## 2024-01-03 ENCOUNTER — Other Ambulatory Visit: Payer: Self-pay | Admitting: Physician Assistant

## 2024-01-04 NOTE — Telephone Encounter (Signed)
 Requested Prescriptions  Pending Prescriptions Disp Refills   buPROPion  (WELLBUTRIN  XL) 150 MG 24 hr tablet [Pharmacy Med Name: buPROPion  HCL XL 150 MG TABLET] 90 tablet 3    Sig: TAKE 1 TABLET BY MOUTH DAILY     Psychiatry: Antidepressants - bupropion  Passed - 01/04/2024  3:26 PM      Passed - Cr in normal range and within 360 days    Creatinine  Date Value Ref Range Status  07/10/2012 0.63 0.60 - 1.30 mg/dL Final   Creatinine, Ser  Date Value Ref Range Status  11/16/2023 0.83 0.57 - 1.00 mg/dL Final         Passed - AST in normal range and within 360 days    AST  Date Value Ref Range Status  11/16/2023 19 0 - 40 IU/L Final         Passed - ALT in normal range and within 360 days    ALT  Date Value Ref Range Status  11/16/2023 13 0 - 32 IU/L Final         Passed - Last BP in normal range    BP Readings from Last 1 Encounters:  12/25/23 130/78         Passed - Valid encounter within last 6 months    Recent Outpatient Visits           1 week ago Annual physical exam   [facility] at [facility], [place]io SQUIBB, [place]   1 month ago Gastroesophageal reflux disease, unspecified whether esophagitis present   [facility] & [facility] Medicine at [facility], Toribio SQUIBB, [place]

## 2024-01-06 ENCOUNTER — Telehealth: Payer: Self-pay | Admitting: Acute Care

## 2024-01-06 DIAGNOSIS — Z87891 Personal history of nicotine dependence: Secondary | ICD-10-CM

## 2024-01-06 DIAGNOSIS — Z122 Encounter for screening for malignant neoplasm of respiratory organs: Secondary | ICD-10-CM

## 2024-01-06 NOTE — Telephone Encounter (Signed)
 Lung Cancer Screening Narrative/Criteria Questionnaire (Cigarette Smokers Only- No Cigars/Pipes/vapes)   Shelley White   SDMV:01/11/2024 12:30p Kristen        [birth year]   LDCT: 01/12/2024 12:30 [facility]    68 y.o.   Phone: [phone]  Lung Screening Narrative (confirm age 93-77 yrs Medicare / 50-80 yrs Private pay insurance)   Insurance information:[employer]   Referring Provider:Daniel Manya PA   This screening involves an initial phone call with a team member from our program. It is called a shared decision making visit. The initial meeting is required by  insurance and Medicare to make sure you understand the program. This appointment takes about 15-20 minutes to complete. You will complete the screening scan at your scheduled date/time.  This scan takes about 5-10 minutes to complete. You can eat and drink normally before and after the scan.  Criteria questions for Lung Cancer Screening:   Are you a current or former smoker? Former Age began smoking: 68yo   If you are a former smoker, what year did you quit smoking? Patient quit several times for about 4 years then quit again for final time in 2024 (total of 5 years quit)(within 15 yrs)   To calculate your smoking history, I need an accurate estimate of how many packs of cigarettes you smoked per day and for how many years. (Not just the number of PPD you are now smoking)   Years smoking 44 x Packs per day 1 = Pack years 44   (at least 20 pack yrs)   (Make sure they understand that we need to know how much they have smoked in the past, not just the number of PPD they are smoking now)  Do you have a personal history of cancer?  No    Do you have a family history of cancer? No  Are you coughing up blood?  No  Have you had unexplained weight loss of 15 lbs or more in the last 6 months? No  It looks like you meet all criteria.  When would be a good time for us  to schedule you for this screening?   Additional information:  N/A

## 2024-01-08 DIAGNOSIS — Z6827 Body mass index (BMI) 27.0-27.9, adult: Secondary | ICD-10-CM | POA: Diagnosis not present

## 2024-01-08 DIAGNOSIS — F419 Anxiety disorder, unspecified: Secondary | ICD-10-CM | POA: Diagnosis not present

## 2024-01-08 DIAGNOSIS — E663 Overweight: Secondary | ICD-10-CM | POA: Diagnosis not present

## 2024-01-08 DIAGNOSIS — Z008 Encounter for other general examination: Secondary | ICD-10-CM | POA: Diagnosis not present

## 2024-01-11 ENCOUNTER — Encounter: Payer: Self-pay | Admitting: *Deleted

## 2024-01-11 ENCOUNTER — Ambulatory Visit: Admitting: *Deleted

## 2024-01-11 DIAGNOSIS — Z87891 Personal history of nicotine dependence: Secondary | ICD-10-CM | POA: Diagnosis not present

## 2024-01-11 NOTE — Progress Notes (Signed)
 Virtual Visit via Telephone Note  I connected with Shelley White on 01/11/24 at 12:30 PM EDT by telephone and verified that I am speaking with the correct person using two identifiers.  Location: Patient: in home Provider: [address] [place], [place], [address]    Shared Decision Making Visit Lung Cancer Screening Program [phone])   Eligibility: Age 68 y.o. Pack Years Smoking History Calculation 44 (# packs/per year x # years smoked) Recent History of coughing up blood  no Unexplained weight loss? no ( >Than 15 pounds within the last 6 months ) Prior History Lung / other cancer no (Diagnosis within the last 5 years already requiring surveillance chest CT Scans). Smoking Status Former Smoker Former Smokers: Years since quit: 1 year  Quit Date: 06-13-22  Visit Components: Discussion included one or more decision making aids. yes Discussion included risk/benefits of screening. yes Discussion included potential follow up diagnostic testing for abnormal scans. yes Discussion included meaning and risk of over diagnosis. yes Discussion included meaning and risk of False Positives. yes Discussion included meaning of total radiation exposure. yes  Counseling Included: Importance of adherence to annual lung cancer LDCT screening. yes Impact of comorbidities on ability to participate in the program. yes Ability and willingness to under diagnostic treatment. yes  Smoking Cessation Counseling: Current Smokers:  Discussed importance of smoking cessation. yes Information about tobacco cessation classes and interventions provided to patient. yes Patient provided with ticket for LDCT Scan. yes Symptomatic Patient. no  Counseling NA Diagnosis Code: Tobacco Use Z72.0 Asymptomatic Patient yes  Counseling (Intermediate counseling: > three minutes counseling) [id number]  Counseled patient 4 minutes regarding tobacco use.   Former Smokers:  Discussed the importance of maintaining  cigarette abstinence. yes Diagnosis Code: Personal History of Nicotine Dependence. S12.108 Information about tobacco cessation classes and interventions provided to patient. Yes Patient provided with ticket for LDCT Scan. yes Written Order for Lung Cancer Screening with LDCT placed in Epic. Yes (CT Chest Lung Cancer Screening Low Dose W/O CM) [id number] Z12.2-Screening of respiratory organs Z87.891-Personal history of nicotine dependence   Josette Ranger, RN 01/11/24

## 2024-01-11 NOTE — Patient Instructions (Signed)

## 2024-01-12 ENCOUNTER — Ambulatory Visit
Admission: RE | Admit: 2024-01-12 | Discharge: 2024-01-12 | Disposition: A | Discharge status: Home / Self Care | Source: Ambulatory Visit | Attending: Acute Care | Admitting: Acute Care

## 2024-01-12 DIAGNOSIS — Z122 Encounter for screening for malignant neoplasm of respiratory organs: Secondary | ICD-10-CM | POA: Insufficient documentation

## 2024-01-12 DIAGNOSIS — Z87891 Personal history of nicotine dependence: Secondary | ICD-10-CM | POA: Insufficient documentation

## 2024-01-18 ENCOUNTER — Other Ambulatory Visit: Payer: Self-pay

## 2024-01-18 DIAGNOSIS — Z122 Encounter for screening for malignant neoplasm of respiratory organs: Secondary | ICD-10-CM

## 2024-01-18 DIAGNOSIS — Z87891 Personal history of nicotine dependence: Secondary | ICD-10-CM

## 2024-01-20 ENCOUNTER — Encounter: Payer: Self-pay | Admitting: Physician Assistant

## 2024-01-20 ENCOUNTER — Other Ambulatory Visit: Payer: Self-pay | Admitting: Physician Assistant

## 2024-01-20 DIAGNOSIS — I700 Atherosclerosis of aorta: Secondary | ICD-10-CM | POA: Insufficient documentation

## 2024-01-20 DIAGNOSIS — I2510 Atherosclerotic heart disease of native coronary artery without angina pectoris: Secondary | ICD-10-CM | POA: Insufficient documentation

## 2024-01-20 DIAGNOSIS — J439 Emphysema, unspecified: Secondary | ICD-10-CM | POA: Insufficient documentation

## 2024-02-23 NOTE — Telephone Encounter (Signed)
 Please review.  KP

## 2024-02-24 ENCOUNTER — Ambulatory Visit (INDEPENDENT_AMBULATORY_CARE_PROVIDER_SITE_OTHER): Admitting: Physician Assistant

## 2024-02-24 ENCOUNTER — Encounter: Payer: Self-pay | Admitting: Physician Assistant

## 2024-02-24 VITALS — BP 146/78 | HR 62 | Ht 66.0 in | Wt 172.0 lb

## 2024-02-24 DIAGNOSIS — R0609 Other forms of dyspnea: Secondary | ICD-10-CM | POA: Insufficient documentation

## 2024-02-24 DIAGNOSIS — F419 Anxiety disorder, unspecified: Secondary | ICD-10-CM | POA: Diagnosis not present

## 2024-02-24 DIAGNOSIS — I10 Essential (primary) hypertension: Secondary | ICD-10-CM | POA: Diagnosis not present

## 2024-02-24 MED ORDER — ALBUTEROL SULFATE HFA 108 (90 BASE) MCG/ACT IN AERS
2.0000 | INHALATION_SPRAY | Freq: Four times a day (QID) | RESPIRATORY_TRACT | 0 refills | Status: AC | PRN
Start: 2024-02-24 — End: ?

## 2024-02-24 NOTE — Progress Notes (Signed)
 Date:  02/24/2024   Name:  Shelley White   DOB:  1956-03-02   MRN:  [id number]   Chief Complaint: Medical Management of Chronic Issues (OV f/u propranolol .)  HPI  Shelley White presents virtually for 67-month follow-up after starting propranolol  last visit which I had hoped would help with her mild anxiety, HTN, and paroxysmal SVT.  I also had her taper off venlafaxine  which she handled without much difficulty. Has been anxious about her mother's health recently.   Since our last visit she also completed a DEXA scan and LDCT.  DEXA showed osteopenia but quite mild, and LDCT showed some signs of emphysema (former smoker) and mild/minimal atherosclerosis of the aorta and coronary arteries.  Medication list has been reviewed and updated.  Current Meds  Medication Sig   albuterol  (VENTOLIN  HFA) 108 (90 Base) MCG/ACT inhaler Inhale 2 puffs into the lungs every 6 (six) hours as needed for wheezing or shortness of breath.   aspirin  EC 81 MG tablet Take 81 mg by mouth daily. Swallow whole.   azelastine  (ASTELIN ) 0.1 % nasal spray Place 2 sprays into both nostrils 2 (two) times daily. Use in each nostril as directed   buPROPion  (WELLBUTRIN  XL) 150 MG 24 hr tablet TAKE 1 TABLET BY MOUTH DAILY   cetirizine (ZYRTEC) 10 MG tablet Take 10 mg by mouth at bedtime.    hydrOXYzine  (ATARAX ) 25 MG tablet Take 0.5 tablets (12.5 mg total) by mouth at bedtime.   Omega-3 Fatty Acids (OMEGA 3 500) 500 MG CAPS Take 500 mg by mouth daily.   omeprazole  (PRILOSEC) 20 MG capsule Take 1 capsule (20 mg total) by mouth as needed.   propranolol  (INDERAL ) 20 MG tablet Take 1 tablet (20 mg total) by mouth 2 (two) times daily. (Patient taking differently: Take 20 mg by mouth daily.)   simvastatin  (ZOCOR ) 10 MG tablet Take 1 tablet (10 mg total) by mouth at bedtime.   zinc gluconate 50 MG tablet Take 50 mg by mouth daily.     Review of Systems  Patient Active Problem List   Diagnosis Date Noted   Dyspnea on exertion  02/24/2024   COPD with emphysema (HCC) 01/20/2024   Aortic atherosclerosis 01/20/2024   Coronary artery calcification seen on CT scan 01/20/2024   Hot flashes 12/25/2023   Hypertension 11/16/2023   Mild hyperlipidemia 09/15/2022   Paroxysmal SVT (supraventricular tachycardia) 09/10/2022   Gastroesophageal reflux disease 08/08/2022   History of vitamin D  deficiency 08/08/2022   Osteopenia after menopause 08/08/2022   Diverticulosis 07/10/2022   Former smoker 07/10/2022   Mild anxiety 07/10/2022   Palpitations 07/10/2022   Polyp of colon    Hematuria 01/03/2019   Chest pain 12/08/2017    Allergies  Allergen Reactions   Codeine Itching and Swelling    Immunization History  Administered Date(s) Administered   PFIZER(Purple Top)SARS-COV-2 Vaccination 06/13/2019, 07/10/2019   Pfizer(Comirnaty)Fall Seasonal Vaccine 12 years and older 02/06/2023   Zoster Recombinant(Shingrix ) 02/26/2022, 08/08/2022    Past Surgical History:  Procedure Laterality Date   ABDOMINAL HYSTERECTOMY     BREAST BIOPSY Right    ADH   BREAST LUMPECTOMY Right 2007   ADH   COLONOSCOPY WITH PROPOFOL  N/A 11/18/2019   Procedure: COLONOSCOPY WITH PROPOFOL ;  Surgeon: Janalyn Keene NOVAK, MD;  Location: [facility];  Service: Endoscopy;  Laterality: N/A;   CYSTOSCOPY WITH BIOPSY N/A 07/08/2019   Procedure: CYSTOSCOPY WITH  BIOPSY;  Surgeon: Francisca Redell BROCKS, MD;  Location: [facility];  Service: Urology;  Laterality: N/A;   TONSILLECTOMY     age 89   URETEROSCOPY Bilateral 07/08/2019   Procedure: DIAGNOSTIC URETEROSCOPY;  Surgeon: Francisca Redell BROCKS, MD;  Location: [facility];  Service: Urology;  Laterality: Bilateral;    Social History   Tobacco Use   Smoking status: Former    Current packs/day: 0.00    Average packs/day: 1 pack/day for 30.0 years (30.0 ttl pk-yrs)    Types: Cigarettes    Start date: 06/12/1992    Quit date: 06/13/2022    Years since quitting: 1.7   Smokeless tobacco: Never  Vaping Use    Vaping status: Never Used  Substance Use Topics   Alcohol use: No   Drug use: No    Family History  Problem Relation Age of Onset   Stroke Mother    Hypertension Mother    Diabetes Mother    Cancer Father    Colon cancer Father    Heart attack Brother    Breast cancer Neg Hx         02/24/2024    1:37 PM 12/25/2023    2:08 PM 11/16/2023    1:23 PM 02/06/2023    1:20 PM  GAD 7 : Generalized Anxiety Score  Nervous, Anxious, on Edge 1 1 1  0  Control/stop worrying 1 1 0 1  Worry too much - different things 1 1 1 1   Trouble relaxing 1 1 1 1   Restless 1 1 1 1   Easily annoyed or irritable 1 1 1 1   Afraid - awful might happen 0 0 0 1  Total GAD 7 Score 6 6 5 6   Anxiety Difficulty Not difficult at all Not difficult at all Not difficult at all Not difficult at all       02/24/2024    1:37 PM 12/25/2023    2:08 PM 11/16/2023    1:23 PM  Depression screen PHQ 2/9  Decreased Interest 0 0 0  Down, Depressed, Hopeless 0 0 0  PHQ - 2 Score 0 0 0  Altered sleeping 0 0 1  Tired, decreased energy 2 0 3  Change in appetite 0 0 1  Feeling bad or failure about yourself  0 0 0  Trouble concentrating 0 0 2  Moving slowly or fidgety/restless 0 0 1  Suicidal thoughts 0 0 0  PHQ-9 Score 2 0  8   Difficult doing work/chores Not difficult at all Not difficult at all Not difficult at all     Data saved with a previous flowsheet row definition    BP Readings from Last 3 Encounters:  02/24/24 (!) 146/78  12/25/23 130/78  11/16/23 (!) 160/88    Wt Readings from Last 3 Encounters:  02/24/24 172 lb (78 kg)  12/25/23 170 lb (77.1 kg)  11/16/23 171 lb (77.6 kg)    BP (!) 146/78 (Cuff Size: Large)   Pulse 62   Ht 5' 6 (1.676 m)   Wt 172 lb (78 kg)   SpO2 97%   BMI 27.76 kg/m   Physical Exam Vitals and nursing note reviewed.  Constitutional:      Appearance: Normal appearance.  Cardiovascular:     Rate and Rhythm: Normal rate and regular rhythm.     Heart sounds: No murmur  heard.    No friction rub. No gallop.  Pulmonary:     Effort: Pulmonary effort is normal.     Breath sounds: Normal breath sounds.  Abdominal:     General: There is no distension.  Musculoskeletal:        General: Normal range of motion.  Skin:    General: Skin is warm and dry.  Neurological:     Mental Status: She is alert and oriented to person, place, and time.     Gait: Gait is intact.  Psychiatric:        Mood and Affect: Mood and affect normal.     Recent Labs     Component Value Date/Time   NA 142 11/16/2023 1352   NA 142 07/10/2012 1058   K 4.2 11/16/2023 1352   K 3.6 07/10/2012 1058   CL 106 11/16/2023 1352   CL 111 (H) 07/10/2012 1058   CO2 22 11/16/2023 1352   CO2 27 07/10/2012 1058   GLUCOSE 80 11/16/2023 1352   GLUCOSE 114 (H) 02/21/2020 1101   GLUCOSE 94 07/10/2012 1058   BUN 10 11/16/2023 1352   BUN 8 07/10/2012 1058   CREATININE 0.83 11/16/2023 1352   CREATININE 0.63 07/10/2012 1058   CALCIUM 9.0 11/16/2023 1352   CALCIUM 8.7 07/10/2012 1058   PROT 6.9 11/16/2023 1352   ALBUMIN 4.4 11/16/2023 1352   AST 19 11/16/2023 1352   ALT 13 11/16/2023 1352   ALKPHOS 95 11/16/2023 1352   BILITOT 0.7 11/16/2023 1352   GFRNONAA >60 02/21/2020 1101   GFRNONAA >60 07/10/2012 1058   GFRAA >60 06/21/2019 1657   GFRAA >60 07/10/2012 1058    Lab Results  Component Value Date   WBC 5.9 11/16/2023   HGB 11.8 11/16/2023   HCT 37.8 11/16/2023   MCV 88 11/16/2023   PLT 277 11/16/2023   No results found for: HGBA1C Lab Results  Component Value Date   CHOL 143 11/16/2023   HDL 53 11/16/2023   LDLCALC 68 11/16/2023   TRIG 124 11/16/2023   CHOLHDL 2.7 11/16/2023   Lab Results  Component Value Date   TSH 0.941 11/16/2023      Assessment and Plan:  Primary hypertension Assessment & Plan: Elevated today x2, but patient reports mostly normal readings at home. Increase propranolol  to BID with ongoing home BP monitoring and a log for my review next time.     Dyspnea on exertion Assessment & Plan: Will try albuterol  to see if the problem may be pulmonary. Consider also deconditioning. Low suspicion for significant heart disease.   Orders: -     Albuterol  Sulfate HFA; Inhale 2 puffs into the lungs every 6 (six) hours as needed for wheezing or shortness of breath.  Dispense: 8 g; Refill: 0  Mild anxiety Assessment & Plan: GAD score stable, increase propranolol  to BID.       Return in about 4 weeks (around 03/23/2024) for OV f/u HTN.    Rolan Hoyle, PA-C, DMSc, Nutritionist [facility] [phone]

## 2024-02-24 NOTE — Assessment & Plan Note (Signed)
 Will try albuterol  to see if the problem may be pulmonary. Consider also deconditioning. Low suspicion for significant heart disease.

## 2024-02-24 NOTE — Assessment & Plan Note (Signed)
 GAD score stable, increase propranolol  to BID.

## 2024-02-24 NOTE — Assessment & Plan Note (Signed)
 Elevated today x2, but patient reports mostly normal readings at home. Increase propranolol  to BID with ongoing home BP monitoring and a log for my review next time.

## 2024-02-25 ENCOUNTER — Ambulatory Visit: Admitting: Emergency Medicine

## 2024-02-25 VITALS — Ht 65.5 in | Wt 172.0 lb

## 2024-02-25 DIAGNOSIS — Z0000 Encounter for general adult medical examination without abnormal findings: Secondary | ICD-10-CM | POA: Diagnosis not present

## 2024-02-25 NOTE — Patient Instructions (Signed)
 Shelley White,  Thank you for taking the time for your Medicare Wellness Visit. I appreciate your continued commitment to your health goals. Please review the care plan we discussed, and feel free to reach out if I can assist you further.  Please note that Annual Wellness Visits do not include a physical exam. Some assessments may be limited, especially if the visit was conducted virtually. If needed, we may recommend an in-person follow-up with your provider.  Ongoing Care Seeing your primary care provider every 3 to 6 months helps us  monitor your health and provide consistent, personalized care.   Referrals If a referral was made during today's visit and you haven't received any updates within two weeks, please contact the referred provider directly to check on the status.  Recommended Screenings:  You may get the covid vaccine at your local pharmacy at your convenience.   Health Maintenance  Topic Date Due   DTaP/Tdap/Td vaccine (1 - Tdap) Never done   Pneumococcal Vaccine for age over 44 (1 of 2 - PCV) Never done   Medicare Annual Wellness Visit  08/08/2023   COVID-19 Vaccine (4 - 2025-26 season) 12/14/2023   Flu Shot  07/12/2024*   Colon Cancer Screening  11/17/2024   Breast Cancer Screening  12/30/2024   Screening for Lung Cancer  01/11/2025   DEXA scan (bone density measurement)  12/30/2028   Hepatitis C Screening  Completed   Zoster (Shingles) Vaccine  Completed   Meningitis B Vaccine  Aged Out  *Topic was postponed. The date shown is not the original due date.       02/25/2024   10:42 AM  Advanced Directives  Does Patient Have a Medical Advance Directive? No  Would patient like information on creating a medical advance directive? Yes (MAU/Ambulatory/Procedural Areas - Information given)    Vision: Annual vision screenings are recommended for early detection of glaucoma, cataracts, and diabetic retinopathy. These exams can also reveal signs of chronic conditions such  as diabetes and high blood pressure.  Dental: Annual dental screenings help detect early signs of oral cancer, gum disease, and other conditions linked to overall health, including heart disease and diabetes.  Please see the attached documents for additional preventive care recommendations.   Fall Prevention in the Home, Adult Falls can cause injuries and affect people of all ages. There are many simple things that you can do to make your home safe and to help prevent falls. If you need it, ask for help making these changes. What actions can I take to prevent falls? General information Use good lighting in all rooms. Make sure to: Replace any light bulbs that burn out. Turn on lights if it is dark and use night-lights. Keep items that you use often in easy-to-reach places. Lower the shelves around your home if needed. Move furniture so that there are clear paths around it. Do not keep throw rugs or other things on the floor that can make you trip. If any of your floors are uneven, fix them. Add color or contrast paint or tape to clearly mark and help you see: Grab bars or handrails. First and last steps of staircases. Where the edge of each step is. If you use a ladder or stepladder: Make sure that it is fully opened. Do not climb a closed ladder. Make sure the sides of the ladder are locked in place. Have someone hold the ladder while you use it. Know where your pets are as you move through your home. What  can I do in the bathroom?     Keep the floor dry. Clean up any water that is on the floor right away. Remove soap buildup in the bathtub or shower. Buildup makes bathtubs and showers slippery. Use non-skid mats or decals on the floor of the bathtub or shower. Attach bath mats securely with double-sided, non-slip rug tape. If you need to sit down while you are in the shower, use a non-slip stool. Install grab bars by the toilet and in the bathtub and shower. Do not use towel  bars as grab bars. What can I do in the bedroom? Make sure that you have a light by your bed that is easy to reach. Do not use any sheets or blankets on your bed that hang to the floor. Have a firm bench or chair with side arms that you can use for support when you get dressed. What can I do in the kitchen? Clean up any spills right away. If you need to reach something above you, use a sturdy step stool that has a grab bar. Keep electrical cables out of the way. Do not use floor polish or wax that makes floors slippery. What can I do with my stairs? Do not leave anything on the stairs. Make sure that you have a light switch at the top and the bottom of the stairs. Have them installed if you do not have them. Make sure that there are handrails on both sides of the stairs. Fix handrails that are broken or loose. Make sure that handrails are as long as the staircases. Install non-slip stair treads on all stairs in your home if they do not have carpet. Avoid having throw rugs at the top or bottom of stairs, or secure the rugs with carpet tape to prevent them from moving. Choose a carpet design that does not hide the edge of steps on the stairs. Make sure that carpet is firmly attached to the stairs. Fix any carpet that is loose or worn. What can I do on the outside of my home? Use bright outdoor lighting. Repair the edges of walkways and driveways and fix any cracks. Clear paths of anything that can make you trip, such as tools or rocks. Add color or contrast paint or tape to clearly mark and help you see high doorway thresholds. Trim any bushes or trees on the main path into your home. Check that handrails are securely fastened and in good repair. Both sides of all steps should have handrails. Install guardrails along the edges of any raised decks or porches. Have leaves, snow, and ice cleared regularly. Use sand, salt, or ice melt on walkways during winter months if you live where there is ice  and snow. In the garage, clean up any spills right away, including grease or oil spills. What other actions can I take? Review your medicines with your health care provider. Some medicines can make you confused or feel dizzy. This can increase your chance of falling. Wear closed-toe shoes that fit well and support your feet. Wear shoes that have rubber soles and low heels. Use a cane, walker, scooter, or crutches that help you move around if needed. Talk with your provider about other ways that you can decrease your risk of falls. This may include seeing a physical therapist to learn to do exercises to improve movement and strength. Where to find more information Centers for Disease Control and Prevention, [employer]: tonerpromos.no [employer] on Aging: baseringtones.pl National Institute on Aging:  baseringtones.pl Contact a health care provider if: You are afraid of falling at home. You feel weak, drowsy, or dizzy at home. You fall at home. Get help right away if you: Lose consciousness or have trouble moving after a fall. Have a fall that causes a head injury. These symptoms may be an emergency. Get help right away. Call 911. Do not wait to see if the symptoms will go away. Do not drive yourself to the hospital. This information is not intended to replace advice given to you by your health care provider. Make sure you discuss any questions you have with your health care provider. Document Revised: 12/02/2021 Document Reviewed: 12/02/2021 Elsevier Patient Education  2024 [employer].

## 2024-02-25 NOTE — Progress Notes (Signed)
 Chief Complaint  Patient presents with   Medicare Wellness     Subjective:   Shelley White is a 68 y.o. female who presents for a Medicare Annual Wellness Visit.  Allergies (verified) Codeine   History: Past Medical History:  Diagnosis Date   Anxiety    Arthritis    left arm   COVID-19 10/2018   Depression    Diverticulosis    Encounter for screening colonoscopy    GERD (gastroesophageal reflux disease)    Long-term current use of proton pump inhibitor therapy 11/16/2023   Past Surgical History:  Procedure Laterality Date   ABDOMINAL HYSTERECTOMY     BREAST BIOPSY Right    ADH   BREAST LUMPECTOMY Right 2007   ADH   COLONOSCOPY WITH PROPOFOL  N/A 11/18/2019   Procedure: COLONOSCOPY WITH PROPOFOL ;  Surgeon: Janalyn Keene NOVAK, MD;  Location: [facility];  Service: Endoscopy;  Laterality: N/A;   CYSTOSCOPY WITH BIOPSY N/A 07/08/2019   Procedure: CYSTOSCOPY WITH  BIOPSY;  Surgeon: Francisca Redell BROCKS, MD;  Location: [facility];  Service: Urology;  Laterality: N/A;   TONSILLECTOMY     age 64   URETEROSCOPY Bilateral 07/08/2019   Procedure: DIAGNOSTIC URETEROSCOPY;  Surgeon: Francisca Redell BROCKS, MD;  Location: [facility];  Service: Urology;  Laterality: Bilateral;   Family History  Problem Relation Age of Onset   Stroke Mother    Hypertension Mother    Diabetes Mother    Cancer Father    Colon cancer Father    Heart attack Brother    Breast cancer Neg Hx    Social History   Occupational History   Not on file  Tobacco Use   Smoking status: Former    Current packs/day: 0.00    Average packs/day: 1 pack/day for 30.0 years (30.0 ttl pk-yrs)    Types: Cigarettes    Start date: 06/12/1992    Quit date: 06/13/2022    Years since quitting: 1.7   Smokeless tobacco: Never  Vaping Use   Vaping status: Never Used  Substance and Sexual Activity   Alcohol use: No   Drug use: No   Sexual activity: Yes    Birth control/protection: Post-menopausal   Tobacco  Counseling Counseling given: Not Answered  SDOH Screenings   Food Insecurity: No Food Insecurity (02/25/2024)  Housing: Low Risk  (02/25/2024)  Transportation Needs: No Transportation Needs (02/25/2024)  Utilities: Not At Risk (02/25/2024)  Depression (PHQ2-9): Low Risk  (02/25/2024)  Financial Resource Strain: Low Risk  (08/08/2022)  Physical Activity: Inactive (02/25/2024)  Social Connections: Moderately Isolated (02/25/2024)  Stress: No Stress Concern Present (02/25/2024)  Tobacco Use: Medium Risk (02/25/2024)  Health Literacy: Inadequate Health Literacy (02/25/2024)   See flowsheets for full screening details  Depression Screen PHQ 2 & 9 Depression Scale- Over the past 2 weeks, how often have you been bothered by any of the following problems? Little interest or pleasure in doing things: 0 Feeling down, depressed, or hopeless (PHQ Adolescent also includes...irritable): 0 PHQ-2 Total Score: 0 Trouble falling or staying asleep, or sleeping too much: 0 Feeling tired or having little energy: 2 Poor appetite or overeating (PHQ Adolescent also includes...weight loss): 0 Feeling bad about yourself - or that you are a failure or have let yourself or your family down: 0 Trouble concentrating on things, such as reading the newspaper or watching television (PHQ Adolescent also includes...like school work): 0 Moving or speaking so slowly that other people could have noticed. Or the opposite - being so  fidgety or restless that you have been moving around a lot more than usual: 0 Thoughts that you would be better off dead, or of hurting yourself in some way: 0 PHQ-9 Total Score: 2 If you checked off any problems, how difficult have these problems made it for you to do your work, take care of things at home, or get along with other people?: Not difficult at all  Depression Treatment Depression Interventions/Treatment : EYV7-0 Score <4 Follow-up Not Indicated     Goals Addressed                This Visit's Progress     lose some weight (pt-stated)         Visit info / Clinical Intake: Medicare Wellness Visit Type:: Subsequent Annual Wellness Visit Persons participating in visit:: patient Medicare Wellness Visit Mode:: Telephone If telephone:: video declined Because this visit was a virtual/telehealth visit:: pt reported vitals If Telephone or Video please confirm:: I connected with the patient using audio enabled telemedicine application and verified that I am speaking with the correct person using two identifiers; I discussed the limitations of evaluation and management by telemedicine; The patient expressed understanding and agreed to proceed Patient Location:: home Provider Location:: home Information given by:: patient Interpreter Needed?: No Pre-visit prep was completed: yes AWV questionnaire completed by patient prior to visit?: no Living arrangements:: lives with spouse/significant other; with family/others (79 year old mother lives with patient. She is the caregiver) Patient's Overall Health Status Rating: good Typical amount of pain: some Does pain affect daily life?: no Are you currently prescribed opioids?: no  Dietary Habits and Nutritional Risks How many meals a day?: 3 Eats fruit and vegetables daily?: yes Most meals are obtained by: preparing own meals; eating out (50/50) In the last 2 weeks, have you had any of the following?: none Diabetic:: no  Functional Status Activities of Daily Living (to include ambulation/medication): Independent Ambulation: Independent with device- listed below Home Assistive Devices/Equipment: Eyeglasses Medication Administration: Independent Home Management: Independent Manage your own finances?: yes Primary transportation is: driving Concerns about vision?: (!) yes (has cataracts, but not ready to be removed yet) Concerns about hearing?: no  Fall Screening Falls in the past year?: 1 Number of falls in past  year: 1 Was there an injury with Fall?: 1 Fall Risk Category Calculator: 3 Patient Fall Risk Level: High Fall Risk  Fall Risk Patient at Risk for Falls Due to: History of fall(s); Impaired balance/gait Fall risk Follow up: Falls evaluation completed; Education provided  Home and Transportation Safety: All rugs have non-skid backing?: yes All stairs or steps have railings?: yes Grab bars in the bathtub or shower?: yes Have non-skid surface in bathtub or shower?: yes Good home lighting?: yes Regular seat belt use?: yes Hospital stays in the last year:: no  Cognitive Assessment Difficulty concentrating, remembering, or making decisions? : yes (sometimes in memory) Will 6CIT or Mini Cog be Completed: yes What year is it?: 0 points What month is it?: 0 points Give patient an address phrase to remember (5 components): [address] [place] About what time is it?: 0 points Count backwards from 20 to 1: 0 points Say the months of the year in reverse: 0 points Repeat the address phrase from earlier: 2 points 6 CIT Score: 2 points  Advance Directives (For Healthcare) Does Patient Have a Medical Advance Directive?: No Would patient like information on creating a medical advance directive?: Yes (MAU/Ambulatory/Procedural Areas - Information given)  Reviewed/Updated  Reviewed/Updated: Reviewed All (Medical, Surgical, Family, Medications, Allergies, Care Teams, Patient Goals)        Objective:    Today's Vitals   02/25/24 1034  Weight: 172 lb (78 kg)  Height: 5' 5.5 (1.664 m)   Body mass index is 28.19 kg/m.  Current Medications (verified) Outpatient Encounter Medications as of 02/25/2024  Medication Sig   albuterol  (VENTOLIN  HFA) 108 (90 Base) MCG/ACT inhaler Inhale 2 puffs into the lungs every 6 (six) hours as needed for wheezing or shortness of breath.   aspirin  EC 81 MG tablet Take 81 mg by mouth daily. Swallow whole.   azelastine  (ASTELIN ) 0.1 % nasal spray Place 2  sprays into both nostrils 2 (two) times daily. Use in each nostril as directed   buPROPion  (WELLBUTRIN  XL) 150 MG 24 hr tablet TAKE 1 TABLET BY MOUTH DAILY   cetirizine (ZYRTEC) 10 MG tablet Take 10 mg by mouth at bedtime.    hydrOXYzine  (ATARAX ) 25 MG tablet Take 0.5 tablets (12.5 mg total) by mouth at bedtime. (Patient taking differently: Take 12.5 mg by mouth at bedtime. Taking 25mg  at bedtime)   Omega-3 Fatty Acids (OMEGA 3 500) 500 MG CAPS Take 500 mg by mouth daily.   omeprazole  (PRILOSEC) 20 MG capsule Take 1 capsule (20 mg total) by mouth as needed.   propranolol  (INDERAL ) 20 MG tablet Take 1 tablet (20 mg total) by mouth 2 (two) times daily.   simvastatin  (ZOCOR ) 10 MG tablet Take 1 tablet (10 mg total) by mouth at bedtime.   zinc gluconate 50 MG tablet Take 50 mg by mouth daily.   No facility-administered encounter medications on file as of 02/25/2024.   Hearing/Vision screen Hearing Screening - Comments:: Denies hearing loss  [facility] - Comments:: [facility], Dr. Feliciano Ober Immunizations and Health Maintenance Health Maintenance  Topic Date Due   DTaP/Tdap/Td (1 - Tdap) Never done   Pneumococcal Vaccine: 50+ Years (1 of 2 - PCV) Never done   COVID-19 Vaccine (4 - 2025-26 season) 12/14/2023   Influenza Vaccine  07/12/2024 (Originally 11/13/2023)   Colonoscopy  11/17/2024   Mammogram  12/30/2024   Lung Cancer Screening  01/11/2025   Medicare Annual Wellness (AWV)  02/24/2025   DEXA SCAN  12/30/2028   Hepatitis C Screening  Completed   Zoster Vaccines- Shingrix   Completed   Meningococcal B Vaccine  Aged Out        Assessment/Plan:  This is a routine wellness examination for Jenafer.  Patient Care Team: Manya Toribio SQUIBB, PA as PCP - General (Physician Assistant) Ober Feliciano Hugger, MD as Consulting Physician (Ophthalmology)  I have personally reviewed and noted the following in the patient's chart:   Medical and social history Use  of alcohol, tobacco or illicit drugs  Current medications and supplements including opioid prescriptions. Functional ability and status Nutritional status Physical activity Advanced directives List of other physicians Hospitalizations, surgeries, and ER visits in previous 12 months Vitals Screenings to include cognitive, depression, and falls Referrals and appointments  No orders of the defined types were placed in this encounter.  In addition, I have reviewed and discussed with patient certain preventive protocols, quality metrics, and best practice recommendations. A written personalized care plan for preventive services as well as general preventive health recommendations were provided to patient.   Vina Ned, CMA   02/25/2024   Return in 55 weeks (on 03/16/2025).  After Visit Summary: (MyChart) Due to this being a telephonic visit, the  after visit summary with patients personalized plan was offered to patient via MyChart   Nurse Notes:  6 CIT Score - 2 Plans to get covid vaccine (pharmacy) Declined flu, pneumonia and Tdap vaccines

## 2024-03-25 ENCOUNTER — Other Ambulatory Visit: Payer: Self-pay | Admitting: Physician Assistant

## 2024-03-25 ENCOUNTER — Ambulatory Visit: Admitting: Physician Assistant

## 2024-03-25 DIAGNOSIS — I10 Essential (primary) hypertension: Secondary | ICD-10-CM

## 2024-03-28 NOTE — Telephone Encounter (Signed)
 Requested Prescriptions  Pending Prescriptions Disp Refills   propranolol  (INDERAL ) 20 MG tablet [Pharmacy Med Name: PROPRANOLOL  20 MG TABLET] 180 tablet 0    Sig: TAKE 1 TABLET BY MOUTH 2 TIMES A DAY     Cardiovascular:  Beta Blockers Failed - 03/28/2024  2:30 PM      Failed - Last BP in normal range    BP Readings from Last 1 Encounters:  02/24/24 (!) 146/78         Passed - Last Heart Rate in normal range    Pulse Readings from Last 1 Encounters:  02/24/24 62         Passed - Valid encounter within last 6 months    Recent Outpatient Visits           1 month ago Primary hypertension   [place] [facility] at [facility], Toribio SQUIBB, [place]   3 months ago Annual physical exam   [facility] at [facility], Torib[place]   4 months ago Gastroesophageal reflux disease, unspecified whether esophagitis present   [facility] Primary Care & Sports Medicine at [facility], Toribio SQUIBB, [place]

## 2024-03-30 ENCOUNTER — Encounter: Payer: Self-pay | Admitting: Physician Assistant

## 2024-03-30 ENCOUNTER — Ambulatory Visit: Admitting: Physician Assistant

## 2024-03-30 VITALS — BP 134/80 | HR 65 | Temp 98.0°F | Ht 65.0 in | Wt 173.0 lb

## 2024-03-30 DIAGNOSIS — I10 Essential (primary) hypertension: Secondary | ICD-10-CM | POA: Diagnosis not present

## 2024-03-30 NOTE — Progress Notes (Signed)
 Date:  03/30/2024   Name:  Shelley White   DOB:  [birth year]   MRN:  [id number]   Chief Complaint: Hypertension (Good readings at home 132/72 right before appt )  HPI  Shelley White returns for 1 month follow-up on HTN after increasing propranolol  to twice daily dosing last time.  Blood pressure has been better controlled, now ~130/70s.   I also prescribed her albuterol  inhaler to see if this would help her chronic DOE. She has not yet picked this up as her mother was in the hospital.   Medication list has been reviewed and updated.  Active Medications[1]   Review of Systems  Patient Active Problem List   Diagnosis Date Noted   Dyspnea on exertion 02/24/2024   COPD with emphysema (HCC) 01/20/2024   Aortic atherosclerosis 01/20/2024   Coronary artery calcification seen on CT scan 01/20/2024   Hot flashes 12/25/2023   Hypertension 11/16/2023   Mild hyperlipidemia 09/15/2022   Paroxysmal SVT (supraventricular tachycardia) 09/10/2022   Gastroesophageal reflux disease 08/08/2022   History of vitamin D  deficiency 08/08/2022   Osteopenia after menopause 08/08/2022   Diverticulosis 07/10/2022   Former smoker 07/10/2022   Mild anxiety 07/10/2022   Palpitations 07/10/2022   Polyp of colon    Hematuria 01/03/2019   Chest pain 12/08/2017    Allergies[2]  Immunization History  Administered Date(s) Administered   PFIZER(Purple Top)SARS-COV-2 Vaccination 06/13/2019, 07/10/2019   Pfizer(Comirnaty)Fall Seasonal Vaccine 12 years and older 02/06/2023   Zoster Recombinant(Shingrix ) 02/26/2022, 08/08/2022    Past Surgical History:  Procedure Laterality Date   ABDOMINAL HYSTERECTOMY     BREAST BIOPSY Right    ADH   BREAST LUMPECTOMY Right 2007   ADH   COLONOSCOPY WITH PROPOFOL  N/A 11/18/2019   Procedure: COLONOSCOPY WITH PROPOFOL ;  Surgeon: Janalyn Keene NOVAK, MD;  Location: [facility];  Service: Endoscopy;  Laterality: N/A;   CYSTOSCOPY WITH BIOPSY N/A 07/08/2019   Procedure:  CYSTOSCOPY WITH  BIOPSY;  Surgeon: Francisca Redell BROCKS, MD;  Location: [facility];  Service: Urology;  Laterality: N/A;   TONSILLECTOMY     age 68   URETEROSCOPY Bilateral 07/08/2019   Procedure: DIAGNOSTIC URETEROSCOPY;  Surgeon: Francisca Redell BROCKS, MD;  Location: [facility];  Service: Urology;  Laterality: Bilateral;    Social History[3]  Family History  Problem Relation Age of Onset   Stroke Mother    Hypertension Mother    Diabetes Mother    Cancer Father    Colon cancer Father    Heart attack Brother    Breast cancer Neg Hx         03/30/2024    2:12 PM 02/24/2024    1:37 PM 12/25/2023    2:08 PM 11/16/2023    1:23 PM  GAD 7 : Generalized Anxiety Score  Nervous, Anxious, on Edge 1 1 1 1   Control/stop worrying 1 1 1  0  Worry too much - different things 1 1 1 1   Trouble relaxing 1 1 1 1   Restless 1 1 1 1   Easily annoyed or irritable 1 1 1 1   Afraid - awful might happen 0 0 0 0  Total GAD 7 Score 6 6 6 5   Anxiety Difficulty Not difficult at all Not difficult at all Not difficult at all Not difficult at all       03/30/2024    2:12 PM 02/25/2024   10:52 AM 02/24/2024    1:37 PM  Depression screen PHQ 2/9  Decreased Interest 0 0 0  Down, Depressed, Hopeless 0 0 0  PHQ - 2 Score 0 0 0  Altered sleeping  0 0  Tired, decreased energy  2 2  Change in appetite  0 0  Feeling bad or failure about yourself   0 0  Trouble concentrating  0 0  Moving slowly or fidgety/restless  0 0  Suicidal thoughts  0 0  PHQ-9 Score  2 2  Difficult doing work/chores  Not difficult at all Not difficult at all    BP Readings from Last 3 Encounters:  03/30/24 134/80  02/24/24 (!) 146/78  12/25/23 130/78    Wt Readings from Last 3 Encounters:  03/30/24 173 lb (78.5 kg)  02/25/24 172 lb (78 kg)  02/24/24 172 lb (78 kg)    BP 134/80 (Cuff Size: Large)   Pulse 65   Temp 98 F (36.7 C)   Ht 5' 5 (1.651 m)   Wt 173 lb (78.5 kg)   SpO2 97%   BMI 28.79 kg/m   Physical Exam Vitals and  nursing note reviewed.  Constitutional:      Appearance: Normal appearance.  Cardiovascular:     Rate and Rhythm: Normal rate.  Pulmonary:     Effort: Pulmonary effort is normal.  Abdominal:     General: There is no distension.  Musculoskeletal:        General: Normal range of motion.  Skin:    General: Skin is warm and dry.  Neurological:     Mental Status: She is alert and oriented to person, place, and time.     Gait: Gait is intact.  Psychiatric:        Mood and Affect: Mood and affect normal.     Recent Labs     Component Value Date/Time   NA 142 11/16/2023 1352   NA 142 07/10/2012 1058   K 4.2 11/16/2023 1352   K 3.6 07/10/2012 1058   CL 106 11/16/2023 1352   CL 111 (H) 07/10/2012 1058   CO2 22 11/16/2023 1352   CO2 27 07/10/2012 1058   GLUCOSE 80 11/16/2023 1352   GLUCOSE 114 (H) 02/21/2020 1101   GLUCOSE 94 07/10/2012 1058   BUN 10 11/16/2023 1352   BUN 8 07/10/2012 1058   CREATININE 0.83 11/16/2023 1352   CREATININE 0.63 07/10/2012 1058   CALCIUM 9.0 11/16/2023 1352   CALCIUM 8.7 07/10/2012 1058   PROT 6.9 11/16/2023 1352   ALBUMIN 4.4 11/16/2023 1352   AST 19 11/16/2023 1352   ALT 13 11/16/2023 1352   ALKPHOS 95 11/16/2023 1352   BILITOT 0.7 11/16/2023 1352   GFRNONAA >60 02/21/2020 1101   GFRNONAA >60 07/10/2012 1058   GFRAA >60 06/21/2019 1657   GFRAA >60 07/10/2012 1058    Lab Results  Component Value Date   WBC 5.9 11/16/2023   HGB 11.8 11/16/2023   HCT 37.8 11/16/2023   MCV 88 11/16/2023   PLT 277 11/16/2023   No results found for: HGBA1C Lab Results  Component Value Date   CHOL 143 11/16/2023   HDL 53 11/16/2023   LDLCALC 68 11/16/2023   TRIG 124 11/16/2023   CHOLHDL 2.7 11/16/2023   Lab Results  Component Value Date   TSH 0.941 11/16/2023      Assessment and Plan:  Primary hypertension Assessment & Plan: Tolerated propranolol  dose increase well.  Continue at this dose.     Follow-up 5 months OV chronic  conditions   Rolan Hoyle, PA-C, DMSc, DipACLM, Nutritionist [facility] Primary  Care and Sports Medicine [facility] [phone]      [1]  Current Meds  Medication Sig   albuterol  (VENTOLIN  HFA) 108 (90 Base) MCG/ACT inhaler Inhale 2 puffs into the lungs every 6 (six) hours as needed for wheezing or shortness of breath.   aspirin  EC 81 MG tablet Take 81 mg by mouth daily. Swallow whole.   azelastine  (ASTELIN ) 0.1 % nasal spray Place 2 sprays into both nostrils 2 (two) times daily. Use in each nostril as directed   buPROPion  (WELLBUTRIN  XL) 150 MG 24 hr tablet TAKE 1 TABLET BY MOUTH DAILY   cetirizine (ZYRTEC) 10 MG tablet Take 10 mg by mouth at bedtime.    hydrOXYzine  (ATARAX ) 25 MG tablet Take 0.5 tablets (12.5 mg total) by mouth at bedtime. (Patient taking differently: Take 12.5 mg by mouth at bedtime. Taking 25mg  at bedtime)   Omega-3 Fatty Acids (OMEGA 3 500) 500 MG CAPS Take 500 mg by mouth daily.   omeprazole  (PRILOSEC) 20 MG capsule Take 1 capsule (20 mg total) by mouth as needed.   propranolol  (INDERAL ) 20 MG tablet TAKE 1 TABLET BY MOUTH 2 TIMES A DAY   simvastatin  (ZOCOR ) 10 MG tablet Take 1 tablet (10 mg total) by mouth at bedtime.   zinc gluconate 50 MG tablet Take 50 mg by mouth daily.  [2]  Allergies Allergen Reactions   Codeine Itching and Swelling  [3]  Social History Tobacco Use   Smoking status: Former    Current packs/day: 0.00    Average packs/day: 1 pack/day for 30.0 years (30.0 ttl pk-yrs)    Types: Cigarettes    Start date: 06/12/1992    Quit date: 06/13/2022    Years since quitting: 1.7   Smokeless tobacco: Never  Vaping Use   Vaping status: Never Used  Substance Use Topics   Alcohol use: No   Drug use: No

## 2024-03-30 NOTE — Assessment & Plan Note (Signed)
 Tolerated propranolol  dose increase well.  Continue at this dose.

## 2024-04-18 ENCOUNTER — Ambulatory Visit: Admitting: Physician Assistant

## 2024-04-25 ENCOUNTER — Other Ambulatory Visit: Payer: Self-pay | Admitting: Physician Assistant

## 2024-04-25 DIAGNOSIS — F419 Anxiety disorder, unspecified: Secondary | ICD-10-CM

## 2024-04-25 DIAGNOSIS — I10 Essential (primary) hypertension: Secondary | ICD-10-CM

## 2024-04-25 DIAGNOSIS — E785 Hyperlipidemia, unspecified: Secondary | ICD-10-CM

## 2024-04-26 ENCOUNTER — Telehealth: Payer: Self-pay

## 2024-04-26 NOTE — Telephone Encounter (Signed)
 Requested Prescriptions  Pending Prescriptions Disp Refills   hydrOXYzine  (ATARAX ) 25 MG tablet [Pharmacy Med Name: hydrOXYzine  HCL 25 MG TABLET] 90 tablet 0    Sig: Take 0.5 tablets (12.5 mg total) by mouth at bedtime. Taking 25mg  at bedtime     Ear, Nose, and Throat:  Antihistamines 2 Passed - 04/26/2024  3:53 PM      Passed - Cr in normal range and within 360 days    Creatinine  Date Value Ref Range Status  07/10/2012 0.63 0.60 - 1.30 mg/dL Final   Creatinine, Ser  Date Value Ref Range Status  11/16/2023 0.83 0.57 - 1.00 mg/dL Final         Passed - Valid encounter within last 12 months    Recent Outpatient Visits           3 weeks ago Primary hypertension   [place] [facility] at [facility], Toribio SQUIBB, [place]   2 months ago Primary hypertension   [facility] Medicine at [facility], Toribio SQUIBB, [place]   4 months ago Annual physical exam   [facility] Care & Sports Medicine at [facility], Toribio SQUIB[place], [place]   5 months ago Gastroesophageal reflux disease, unspecified whether esophagitis present   [facility] Primary Care & Sports Medicine at [facility], Toribio SQUIBB, [place]               simvastatin  (ZOCOR ) 10 MG tablet [Pharmacy Med Name: SIMVASTATIN  10 MG TABLET] 90 tablet 0    Sig: TAKE 1 TABLET BY MOUTH AT BEDTIME     Cardiovascular:  Antilipid - Statins Failed - 04/26/2024  3:53 PM      Failed - Lipid Panel in normal range within the last 12 months    Cholesterol, Total  Date Value Ref Range Status  11/16/2023 143 100 - 199 mg/dL Final   LDL Chol Calc (NIH)  Date Value Ref Range Status  11/16/2023 68 0 - 99 mg/dL Final   HDL  Date Value Ref Range Status  11/16/2023 53 >39 mg/dL Final   Triglycerides  Date Value Ref Range Status  11/16/2023 124 0 - 149 mg/dL Final         Passed - Patient is not pregnant      Passed - Valid encounter within last 12 months    Recent  Outpatient Visits           3 weeks ago Primary hypertension   [place] [facility] at [facility], Torib[place]QU[place]   2 months ago Primary hypertension   [place] [facility] at [facility], Toribio SQUIBB, [place]   4 months ago Annual physical exam   [facility] Care & Sports Medicine at [facility], Toribio SQU[place], [place]   5 months ago Gastroesophageal reflux disease, unspecified whether esophagitis present   [facility] Primary Care & Sports Medicine at [facility], [place] P, [place]               propranolol  (INDERAL ) 20 MG tablet [Pharmacy Med Name: PROPRANOLOL  20 MG TABLET] 180 tablet 0    Sig: TAKE 1 TABLET BY MOUTH 2 TIMES A DAY     Cardiovascular:  Beta Blockers Passed - 04/26/2024  3:53 PM      Passed - Last BP in normal range    BP Readings from Last 1 Encounters:  03/30/24 134/80         Passed - Last Heart Rate in normal range    Pulse Readings from Last 1 Encounters:  03/30/24 65         Passed - Valid encounter within last 6 months    Recent Outpatient Visits           3 weeks ago Primary hypertension   [place] [facility] at [facility], Toribio SQUIBB, [place]   2 months ago Primary hypertension   [facility] Primary Care & Sports Medicine at [facility], Toribio SQUIBB, [place]   4 months ago Annual physical exam   [facility] Primary Care & Sports Medicine at [facility], Toribio SQU[place], [place]   5 months ago Gastroesophageal reflux disease, unspecified whether esophagitis present   [facility] Medicine at [facility], Toribio SQUIBB, [place]

## 2024-04-26 NOTE — Telephone Encounter (Signed)
 Copied from CRM [id number]. Topic: Clinical - Prescription Issue >> Apr 26, 2024  4:20 PM Tinnie BROCKS wrote: Reason for CRM: Olam with [facility] is calling to get clarification on instructions for hydrOXYzine  (ATARAX ) 25 MG tablet. >> Apr 26, 2024  4:22 PM Tinnie BROCKS wrote: Please return call at [phone]

## 2024-04-27 ENCOUNTER — Other Ambulatory Visit: Payer: Self-pay

## 2024-04-27 DIAGNOSIS — F419 Anxiety disorder, unspecified: Secondary | ICD-10-CM

## 2024-04-27 MED ORDER — HYDROXYZINE HCL 25 MG PO TABS: ORAL_TABLET | ORAL | 0 refills | Status: AC

## 2024-04-27 NOTE — Telephone Encounter (Signed)
 Please review.  KP

## 2024-04-27 NOTE — Telephone Encounter (Signed)
 Prescription changed.  KP

## 2024-05-16 ENCOUNTER — Other Ambulatory Visit: Payer: Self-pay | Admitting: Physician Assistant

## 2024-05-16 DIAGNOSIS — K219 Gastro-esophageal reflux disease without esophagitis: Secondary | ICD-10-CM

## 2024-08-29 ENCOUNTER — Ambulatory Visit: Admitting: Physician Assistant

## 2025-03-16 ENCOUNTER — Ambulatory Visit
